# Patient Record
Sex: Female | Born: 1990 | Race: White | Hispanic: No | Marital: Married | Smoking: Never smoker
Health system: Southern US, Community
[De-identification: ages and names within clinical notes are randomized; demographics above are authoritative.]

## PROBLEM LIST (undated history)

## (undated) DIAGNOSIS — A609 Anogenital herpesviral infection, unspecified: Secondary | ICD-10-CM

## (undated) DIAGNOSIS — F32A Depression, unspecified: Secondary | ICD-10-CM

## (undated) DIAGNOSIS — F419 Anxiety disorder, unspecified: Secondary | ICD-10-CM

## (undated) DIAGNOSIS — Z8619 Personal history of other infectious and parasitic diseases: Secondary | ICD-10-CM

## (undated) DIAGNOSIS — D649 Anemia, unspecified: Secondary | ICD-10-CM

## (undated) DIAGNOSIS — H02886 Meibomian gland dysfunction of left eye, unspecified eyelid: Secondary | ICD-10-CM

## (undated) DIAGNOSIS — K589 Irritable bowel syndrome without diarrhea: Secondary | ICD-10-CM

## (undated) HISTORY — DX: Personal history of other infectious and parasitic diseases: Z86.19

## (undated) HISTORY — PX: TONSILLECTOMY: SUR1361

## (undated) HISTORY — PX: WISDOM TOOTH EXTRACTION: SHX21

## (undated) HISTORY — DX: Irritable bowel syndrome, unspecified: K58.9

## (undated) HISTORY — PX: BREAST ENHANCEMENT SURGERY: SHX7

## (undated) HISTORY — DX: Anogenital herpesviral infection, unspecified: A60.9

## (undated) HISTORY — DX: Anxiety disorder, unspecified: F41.9

---
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
## 2012-10-18 ENCOUNTER — Other Ambulatory Visit (HOSPITAL_COMMUNITY)
Admission: RE | Admit: 2012-10-18 | Discharge: 2012-10-18 | Disposition: A | Discharge status: Home / Self Care | Payer: 59 | Source: Ambulatory Visit | Attending: Obstetrics and Gynecology | Admitting: Obstetrics and Gynecology

## 2012-10-18 DIAGNOSIS — Z113 Encounter for screening for infections with a predominantly sexual mode of transmission: Secondary | ICD-10-CM | POA: Insufficient documentation

## 2012-10-18 DIAGNOSIS — N760 Acute vaginitis: Secondary | ICD-10-CM | POA: Insufficient documentation

## 2012-10-18 DIAGNOSIS — Z01419 Encounter for gynecological examination (general) (routine) without abnormal findings: Secondary | ICD-10-CM | POA: Insufficient documentation

## 2013-05-02 ENCOUNTER — Other Ambulatory Visit: Payer: Self-pay | Admitting: Unknown Physician Specialty

## 2013-05-02 DIAGNOSIS — R109 Unspecified abdominal pain: Secondary | ICD-10-CM

## 2013-05-02 DIAGNOSIS — E739 Lactose intolerance, unspecified: Secondary | ICD-10-CM | POA: Insufficient documentation

## 2013-05-06 ENCOUNTER — Other Ambulatory Visit: Payer: 59

## 2013-05-09 ENCOUNTER — Other Ambulatory Visit: Payer: 59

## 2013-05-13 ENCOUNTER — Ambulatory Visit
Admission: RE | Admit: 2013-05-13 | Discharge: 2013-05-13 | Disposition: A | Discharge status: Home / Self Care | Payer: 59 | Source: Ambulatory Visit | Attending: Unknown Physician Specialty | Admitting: Unknown Physician Specialty

## 2013-05-13 DIAGNOSIS — R109 Unspecified abdominal pain: Secondary | ICD-10-CM

## 2013-05-23 ENCOUNTER — Emergency Department (HOSPITAL_COMMUNITY)
Admission: EM | Admit: 2013-05-23 | Discharge: 2013-05-23 | Disposition: A | Discharge status: Home / Self Care | Payer: 59 | Attending: Emergency Medicine | Admitting: Emergency Medicine

## 2013-05-23 ENCOUNTER — Emergency Department (HOSPITAL_COMMUNITY): Payer: 59

## 2013-05-23 ENCOUNTER — Encounter (HOSPITAL_COMMUNITY): Payer: Self-pay | Admitting: Emergency Medicine

## 2013-05-23 DIAGNOSIS — Y9389 Activity, other specified: Secondary | ICD-10-CM | POA: Insufficient documentation

## 2013-05-23 DIAGNOSIS — S60229A Contusion of unspecified hand, initial encounter: Secondary | ICD-10-CM | POA: Insufficient documentation

## 2013-05-23 DIAGNOSIS — S60222A Contusion of left hand, initial encounter: Secondary | ICD-10-CM

## 2013-05-23 DIAGNOSIS — Y92410 Unspecified street and highway as the place of occurrence of the external cause: Secondary | ICD-10-CM | POA: Insufficient documentation

## 2013-05-23 DIAGNOSIS — S59909A Unspecified injury of unspecified elbow, initial encounter: Secondary | ICD-10-CM | POA: Insufficient documentation

## 2013-05-23 DIAGNOSIS — Z79899 Other long term (current) drug therapy: Secondary | ICD-10-CM | POA: Insufficient documentation

## 2013-05-23 DIAGNOSIS — S6990XA Unspecified injury of unspecified wrist, hand and finger(s), initial encounter: Secondary | ICD-10-CM | POA: Insufficient documentation

## 2013-05-23 MED ORDER — MORPHINE SULFATE 2 MG/ML IJ SOLN: 2.0000 mg | Freq: Once | INTRAMUSCULAR | Status: DC

## 2013-05-23 MED ORDER — MORPHINE SULFATE 4 MG/ML IJ SOLN
4.0000 mg | Freq: Once | INTRAMUSCULAR | Status: AC
  Administered 2013-05-23: 4 mg via INTRAVENOUS

## 2013-05-23 MED ORDER — HYDROCODONE-ACETAMINOPHEN 5-325 MG PO TABS: 1.0000 | ORAL_TABLET | Freq: Four times a day (QID) | ORAL | Status: DC | PRN

## 2013-05-23 MED ORDER — MORPHINE SULFATE 4 MG/ML IJ SOLN
4.0000 mg | Freq: Once | INTRAMUSCULAR | Status: DC
  Filled 2013-05-23: qty 1

## 2013-05-23 MED ORDER — HYDROCODONE-ACETAMINOPHEN 5-325 MG PO TABS
1.0000 | ORAL_TABLET | Freq: Once | ORAL | Status: AC
  Administered 2013-05-23: 1 via ORAL
  Filled 2013-05-23: qty 1
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
## 2013-05-23 NOTE — ED Provider Notes (Signed)
I saw and evaluated the patient, reviewed the resident's note and I agree with the findings and plan.  Home with thumb spica and ortho follow up  Lyanne Co, MD 05/23/13 [phone]
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
## 2014-09-04 ENCOUNTER — Other Ambulatory Visit (HOSPITAL_COMMUNITY)
Admission: RE | Admit: 2014-09-04 | Discharge: 2014-09-04 | Disposition: A | Discharge status: Home / Self Care | Payer: 59 | Source: Ambulatory Visit | Attending: Obstetrics and Gynecology | Admitting: Obstetrics and Gynecology

## 2014-09-04 ENCOUNTER — Other Ambulatory Visit: Payer: Self-pay | Admitting: Nurse Practitioner

## 2014-09-04 DIAGNOSIS — Z01419 Encounter for gynecological examination (general) (routine) without abnormal findings: Secondary | ICD-10-CM | POA: Insufficient documentation

## 2014-09-05 LAB — CYTOLOGY - PAP

## 2015-05-05 ENCOUNTER — Telehealth: Payer: Self-pay | Admitting: *Deleted

## 2015-05-05 NOTE — Telephone Encounter (Signed)
Unable to reach patient at time of Pre-Visit Call.  Left message for patient to return call when available.    

## 2015-05-06 ENCOUNTER — Other Ambulatory Visit: Payer: Self-pay | Admitting: Physician Assistant

## 2015-05-06 ENCOUNTER — Ambulatory Visit (INDEPENDENT_AMBULATORY_CARE_PROVIDER_SITE_OTHER): Payer: 59 | Admitting: Physician Assistant

## 2015-05-06 ENCOUNTER — Encounter: Payer: Self-pay | Admitting: Physician Assistant

## 2015-05-06 VITALS — BP 118/72 | HR 72 | Temp 98.2°F | Resp 16 | Ht 65.5 in | Wt 143.4 lb

## 2015-05-06 DIAGNOSIS — T7840XA Allergy, unspecified, initial encounter: Secondary | ICD-10-CM | POA: Insufficient documentation

## 2015-05-06 DIAGNOSIS — Z020 Encounter for examination for admission to educational institution: Secondary | ICD-10-CM | POA: Insufficient documentation

## 2015-05-06 DIAGNOSIS — Z0289 Encounter for other administrative examinations: Secondary | ICD-10-CM

## 2015-05-06 DIAGNOSIS — Z0184 Encounter for antibody response examination: Secondary | ICD-10-CM | POA: Diagnosis not present

## 2015-05-06 NOTE — Assessment & Plan Note (Signed)
Mild. No respiratory involvement.  Will refer to Allergy for testing.

## 2015-05-06 NOTE — Patient Instructions (Signed)
Go to the lab for blood work. We will call you with your results. Follow-up will be based on results.

## 2015-05-06 NOTE — Assessment & Plan Note (Addendum)
Depression screen negative. Health Maintenance reviewed.  Is followed by GYN for pelvic and PAP. Examination within normal limits. Forms filled out for patient.

## 2015-05-06 NOTE — Progress Notes (Signed)
Pre visit review using our clinic review tool, if applicable. No additional management support is needed unless otherwise documented below in the visit note/SLS  

## 2015-05-06 NOTE — Assessment & Plan Note (Signed)
Patient up-to-date on required immunizations. Has documented immunity to varicella.  Will order Hep B surface antibody and TB Gold assay today.

## 2015-05-06 NOTE — Progress Notes (Signed)
Patient presents to clinic today  To establish care. Patient is requesting physical for school. 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 Is needing hepatitis B immunity status testing and TB gold assay.     Patient with past medical history including irritable bowel syndrome and mild anxiety which she controls with diet, exercise the meditation. Is not currently on medication for either problem. Is followed by gynecology who prescribes her contraception patch. Takes a multivitamin but denies other medications.   Patient endorses bee sting of her foot 2 weeks ago that resulted in significant swelling and pain of foot. Patient denies urticaria , shortness of breath or chest tightness. Denies prior reaction to bee sting.  Past Medical History  Diagnosis Date  . History of chicken pox   . IBS (irritable bowel syndrome)   . Anxiety     No current outpatient prescriptions on file prior to visit.   No current facility-administered medications on file prior to visit.    No Known Allergies  Family History  Problem Relation Age of Onset  . Hypertension Father     Living  . Obesity Father   . Heart disease Paternal Grandfather   . Diabetes Paternal Grandfather   . Hyperlipidemia Paternal Grandfather   . Heart attack Paternal Grandfather   . Hypertension Paternal Grandfather   . Breast cancer Paternal Grandmother   . Urolithiasis Mother     Living  . Skin cancer Maternal Grandmother   . Renal Disease Maternal Grandfather   . Bipolar disorder Maternal Aunt   . Healthy Brother     x1    History   Social History  . Marital Status: Single    Spouse Name: N/A  . Number of Children: N/A  . Years of Education: N/A   Social History Main Topics  . Smoking status: Never Smoker   . Smokeless tobacco: Not on file  . Alcohol Use: Yes     Comment: social  . Drug Use: No  . Sexual Activity: Not on file   Other Topics Concern  . None   Social History Narrative   [place]ives in [place]  with mother.    Review of Systems  Constitutional: Negative for fever and weight loss.  HENT: Negative for ear discharge, ear pain, hearing loss and tinnitus.   Eyes: Negative for blurred vision, double vision, photophobia and pain.  Respiratory: Negative for cough and shortness of breath.   Cardiovascular: Negative for chest pain and palpitations.  Gastrointestinal: Negative for heartburn, nausea, vomiting, abdominal pain, diarrhea, constipation, blood in stool and melena.  Genitourinary: Negative for dysuria, urgency, frequency, hematuria and flank pain.  Musculoskeletal: Negative for falls.  Neurological: Negative for dizziness, loss of consciousness and headaches.  Endo/Heme/Allergies: Negative for environmental allergies.  Psychiatric/Behavioral: Negative for depression, suicidal ideas, hallucinations and substance abuse. The patient is not nervous/anxious and does not have insomnia.    BP 118/72 mmHg  Pulse 72  Temp(Src) 98.2 F (36.8 C) (Oral)  Resp 16  Ht 5' 5.5" (1.664 m)  Wt 143 lb 6 oz (65.034 kg)  BMI 23.49 kg/m2  SpO2 100%  LMP 04/08/2015  Physical Exam  Constitutional: She is oriented to person, place, and time and well-developed, well-nourished, and in no distress.  HENT:  Head: Normocephalic and atraumatic.  Right Ear: External ear normal.  Left Ear: External ear normal.  Nose: Nose normal.  Mouth/Throat: Oropharynx is clear and moist.  Tympanic membranes within normal limits bilaterally.  Eyes: Conjunctivae and  EOM are normal. Pupils are equal, round, and reactive to light.  Neck: No thyromegaly present.  Cardiovascular: Normal rate, regular rhythm, normal heart sounds and intact distal pulses.   Pulmonary/Chest: Effort normal and breath sounds normal. No respiratory distress. She has no wheezes. She has no rales. She exhibits no tenderness.  Abdominal: Soft. Bowel sounds are normal. There is no tenderness.  Musculoskeletal: Normal range of motion.    Lymphadenopathy:    She has no cervical adenopathy.  Neurological: She is alert and oriented to person, place, and time.  Skin: Skin is warm and dry. No rash noted.  Psychiatric: Affect normal.  Vitals reviewed.  Assessment/Plan: Immunity status testing Patient up-to-date on required immunizations. Has documented immunity to varicella.  Will order Hep B surface antibody and TB Gold assay today.  Allergic reaction Mild. No respiratory involvement.  Will refer to Allergy for testing.  School physical exam Depression screen negative. Health Maintenance reviewed.  Is followed by GYN for pelvic and PAP. Examination within normal limits. Forms filled out for patient.

## 2015-05-07 LAB — HEPATITIS B SURFACE ANTIBODY,QUALITATIVE: Hep B S Ab: POSITIVE — AB

## 2015-05-08 LAB — QUANTIFERON TB GOLD ASSAY (BLOOD)
Interferon Gamma Release Assay: NEGATIVE
Mitogen value: 6.06 IU/mL
Quantiferon Nil Value: 0.03 IU/mL
Quantiferon Tb Ag Minus Nil Value: 0.01 IU/mL
TB Ag value: 0.04 IU/mL

## 2015-05-14 ENCOUNTER — Telehealth: Payer: Self-pay

## 2015-05-14 DIAGNOSIS — Z0184 Encounter for antibody response examination: Secondary | ICD-10-CM

## 2015-05-14 NOTE — Telephone Encounter (Signed)
Spoke with Silvio Pate, Lab Tech regarding lab order. She said she would call [facility] for correct code to ensure lab will be ordered and drawn correctly.

## 2015-05-14 NOTE — Telephone Encounter (Signed)
Per Silvio Pate, [facility] says [employer] does not have the correct code in the system for this particular lab draw.  Therefore Dana Serrano will draw lab tomorrow and call [facility] have them placed the correct order.

## 2015-05-14 NOTE — Telephone Encounter (Addendum)
Pt states she needs a quantitative HEPATITIS B SURFACE ANTIBODY drawn instead of a qualitative.  Called [facility] to see if it could be added to lab already drawn.  Lab tech stated it was too late.  Pt informed.  Lab appointment scheduled. Future ordered placed.

## 2015-05-14 NOTE — Addendum Note (Signed)
Addended by: Silvio Pate D on: 05/14/2015 03:24 PM   Modules accepted: Orders

## 2015-05-14 NOTE — Telephone Encounter (Signed)
That is what was ordered last time as well.  Can you please verify with the lab that they will draw the correct thing.  Cannot find other order from what you sent me.

## 2015-05-15 ENCOUNTER — Other Ambulatory Visit: Payer: Self-pay | Admitting: Physician Assistant

## 2015-05-15 ENCOUNTER — Other Ambulatory Visit (INDEPENDENT_AMBULATORY_CARE_PROVIDER_SITE_OTHER): Payer: 59

## 2015-05-15 DIAGNOSIS — Z0184 Encounter for antibody response examination: Secondary | ICD-10-CM | POA: Diagnosis not present

## 2015-05-19 LAB — HEPATITIS B SURFACE ANTIBODY, QUANTITATIVE: Hep B S AB Quant (Post): 25.6 m[IU]/mL

## 2015-05-19 LAB — HEPATITIS B DNA, ULTRAQUANTITATIVE, PCR: Hepatitis B DNA: NOT DETECTED IU/mL (ref <20)

## 2016-04-29 ENCOUNTER — Telehealth: Payer: Self-pay | Admitting: Physician Assistant

## 2016-04-29 ENCOUNTER — Other Ambulatory Visit (INDEPENDENT_AMBULATORY_CARE_PROVIDER_SITE_OTHER): Payer: Federal, State, Local not specified - PPO

## 2016-04-29 ENCOUNTER — Other Ambulatory Visit: Payer: Self-pay | Admitting: Physician Assistant

## 2016-04-29 DIAGNOSIS — Z111 Encounter for screening for respiratory tuberculosis: Secondary | ICD-10-CM

## 2016-04-29 DIAGNOSIS — Z020 Encounter for examination for admission to educational institution: Secondary | ICD-10-CM

## 2016-04-29 DIAGNOSIS — Z0184 Encounter for antibody response examination: Secondary | ICD-10-CM | POA: Diagnosis not present

## 2016-04-29 DIAGNOSIS — T7840XA Allergy, unspecified, initial encounter: Secondary | ICD-10-CM

## 2016-04-29 NOTE — Telephone Encounter (Signed)
Scheduled pt

## 2016-04-29 NOTE — Telephone Encounter (Signed)
Can be reached: [phone]  Reason for call: Pt needing TB quantiferon gold test done and has to have results submitted to her school by 05/05/16. Pt due for cpe but last was 05/06/15 so it is too soon for cpe. Can she come in for lab and schedule cpe later?

## 2016-04-29 NOTE — Telephone Encounter (Signed)
That is fine. I will place order and she can be put on the lab schedule.

## 2016-04-30 LAB — HEPATITIS B SURFACE ANTIBODY, QUANTITATIVE: Hepatitis B-Post: 30.7 m[IU]/mL

## 2016-05-02 LAB — QUANTIFERON TB GOLD ASSAY (BLOOD)
Interferon Gamma Release Assay: NEGATIVE
Mitogen-Nil: >10 IU/mL
Quantiferon Nil Value: 0.02 IU/mL
Quantiferon Tb Ag Minus Nil Value: 0.02 IU/mL

## 2016-05-02 LAB — HEPATITIS B DNA, QUALITATIVE: Hepatitis B Virus DNA Qual: NOT DETECTED

## 2016-05-04 ENCOUNTER — Encounter: Payer: Self-pay | Admitting: *Deleted

## 2016-05-06 LAB — HEPATITIS B DNA, ULTRAQUANTITATIVE, PCR
Hepatitis B DNA (Calc): <1.3 Log IU/mL (ref <1.30)
Hepatitis B DNA: <20 IU/mL (ref <20)

## 2016-10-17 ENCOUNTER — Encounter: Payer: Self-pay | Admitting: Physician Assistant

## 2016-10-17 ENCOUNTER — Ambulatory Visit (INDEPENDENT_AMBULATORY_CARE_PROVIDER_SITE_OTHER): Admitting: Physician Assistant

## 2016-10-17 VITALS — BP 118/78 | HR 81 | Temp 98.3°F | Resp 14 | Ht 65.5 in | Wt 148.0 lb

## 2016-10-17 DIAGNOSIS — M79605 Pain in left leg: Secondary | ICD-10-CM

## 2016-10-17 DIAGNOSIS — M79604 Pain in right leg: Secondary | ICD-10-CM | POA: Diagnosis not present

## 2016-10-17 MED ORDER — CELECOXIB 100 MG PO CAPS: 100.0000 mg | ORAL_CAPSULE | Freq: Every day | ORAL | 0 refills | Status: DC | PRN

## 2016-10-17 NOTE — Patient Instructions (Signed)
Please stay well hydrated, especially pre and post workouts. Wear supportive foot wear.  Maybe go easy on the workouts until we get further assessment.  With your exam, muscular cause of symptoms seems most likely. I am setting you up with Dr. Pearletha ForgeHudnall (Sports Medicine) for a comprehensive evaluation.   I have sent in a script for Celebrex in case you need an anti-inflammatory for the episodes. This should be gentler on your stomach but still take with food.   If you do not hear from the specialist by this Wednesday, please give me a call.

## 2016-10-17 NOTE — Progress Notes (Signed)
Pre visit review using our clinic review tool, if applicable. No additional management support is needed unless otherwise documented below in the visit note. 

## 2016-10-17 NOTE — Progress Notes (Signed)
Patient presents to clinic today c/o intermittent pain in bilateral lower extremities x 9 years. Worsening over the past year with increased physical activity. Endorses episodes of pain in lateral anterior lower extremities inferior to knee. Episodes are described as aching and pulling in nature. Are occurring after exercise or with wearing certain footwear. Denies knee, foot or ankle pain. Pain usually 8/10 when present, lasting for few hours to a couple of days. Denies swelling, numbness or tingling of extremities. Denies pain during exercise, only when resting afterwards. Has taken some Ibuprofen with some relief of pain but is trying to avoid NSAIDs due to history of IBS. Notes being sent to an Orthopedist for this issue about 4 years ago. Was told she needed to see a different specialist but cannot elaborate further.   Past Medical History:  Diagnosis Date  . Anxiety   . History of chicken pox   . IBS (irritable bowel syndrome)     Current Outpatient Prescriptions on File Prior to Visit  Medication Sig Dispense Refill  . Multiple Vitamin (MULTIVITAMIN) tablet Take 1 tablet by mouth daily.     No current facility-administered medications on file prior to visit.    No Known Allergies  Family History  Problem Relation Age of Onset  . Hypertension Father     Living  . Obesity Father   . Heart disease Paternal Grandfather   . Diabetes Paternal Grandfather   . Hyperlipidemia Paternal Grandfather   . Heart attack Paternal Grandfather   . Hypertension Paternal Grandfather   . Breast cancer Paternal Grandmother   . Urolithiasis Mother     Living  . Skin cancer Maternal Grandmother   . Renal Disease Maternal Grandfather   . Bipolar disorder Maternal Aunt   . Healthy Brother     x1    Social History   Social History  . Marital status: Single    Spouse name: N/A  . Number of children: N/A  . Years of education: N/A   Social History Main Topics  . Smoking status: Never  Smoker  . Smokeless tobacco: Never Used  . Alcohol use Yes     Comment: social  . Drug use: No  . Sexual activity: Not Asked   Other Topics Concern  . None   Social History Narrative   [place] in [place].    Review of Systems - See HPI.  All other ROS are negative.  BP 118/78   Pulse 81   Temp 98.3 F (36.8 C) (Oral)   Resp 14   Ht 5' 5.5" (1.664 m)   Wt 148 lb (67.1 kg)   SpO2 98%   BMI 24.25 kg/m   Physical Exam  Constitutional: She is oriented to person, place, and time and well-developed, well-nourished, and in no distress.  HENT:  Head: Normocephalic and atraumatic.  Eyes: Conjunctivae are normal.  Cardiovascular: Normal rate, regular rhythm, normal heart sounds and intact distal pulses.   Pulmonary/Chest: Effort normal and breath sounds normal.  Musculoskeletal:       Right knee: Normal.       Left knee: Normal.       Right ankle: Normal.       Left ankle: Normal.       Legs:      Right foot: Normal.       Left foot: Normal.  Negative posterior calf pain. Negative swelling.   Neurological: She is alert and oriented to person, place, and time.  Skin: Skin  is warm and dry.  Vitals reviewed.  Assessment/Plan: 1. Bilateral leg pain Exam reveals mild tenderness to palpation over lateral muscle groups (anterior tibialis versus extensor digitorum longus). Supportive measures and appropriate footwear reviewed. Rx Celebrex to use daily PRN for episodes to cut down on Gi upset.   - Ambulatory referral to Sports Medicine    Piedad ClimesMartin, Lubna Stegeman Cody, PA-C

## 2016-10-19 ENCOUNTER — Ambulatory Visit (INDEPENDENT_AMBULATORY_CARE_PROVIDER_SITE_OTHER): Admitting: Family Medicine

## 2016-10-19 ENCOUNTER — Ambulatory Visit (HOSPITAL_BASED_OUTPATIENT_CLINIC_OR_DEPARTMENT_OTHER)
Admission: RE | Admit: 2016-10-19 | Discharge: 2016-10-19 | Disposition: A | Discharge status: Home / Self Care | Source: Ambulatory Visit | Attending: Family Medicine | Admitting: Family Medicine

## 2016-10-19 ENCOUNTER — Telehealth: Payer: Self-pay | Admitting: Family Medicine

## 2016-10-19 ENCOUNTER — Encounter: Payer: Self-pay | Admitting: Family Medicine

## 2016-10-19 VITALS — BP 112/69 | HR 79 | Ht 65.0 in | Wt 148.0 lb

## 2016-10-19 DIAGNOSIS — M79605 Pain in left leg: Secondary | ICD-10-CM | POA: Insufficient documentation

## 2016-10-19 DIAGNOSIS — M79604 Pain in right leg: Secondary | ICD-10-CM | POA: Insufficient documentation

## 2016-10-19 NOTE — Patient Instructions (Signed)
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 Ice the area 15 minutes at a time 3-4 times a day and after exercise. Medicines are unlikely to be helpful long term. Start physical therapy and do home exercises on days you don't go to therapy. Inserts are important - OTC one (dr. Jari Sportsmanscholls active series, spencos, superfeet), sports insoles with scaphoid pads, or custom orthotics. If after 5-6 weeks you're not improving call me and we will go ahead with compartment pressure testing.

## 2016-10-19 NOTE — Telephone Encounter (Signed)
Called patient - orders placed - she will come back to get them and we will call her with results.

## 2016-10-24 DIAGNOSIS — M79604 Pain in right leg: Secondary | ICD-10-CM | POA: Insufficient documentation

## 2016-10-24 DIAGNOSIS — M79605 Pain in left leg: Principal | ICD-10-CM

## 2016-10-24 NOTE — Assessment & Plan Note (Signed)
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  Independently reviewed radiographs of tibias to ensure no anterior tibial stress fracture referring pain - these are normal.  She will start with conservative measures - physical therapy, orthotics, icing.  Tylenol or ibuprofen if needed.  We discussed compartment pressure testing as next step if she would consider possible fasciotomy.  She will call us if not improving.

## 2016-10-24 NOTE — Progress Notes (Signed)
PCP and consultation requested by: Piedad ClimesMartin, William Cody, PA-C  Subjective:   HPI: Patient is a 25 y.o. female here for bilateral leg pain.  Patient reports she's had intermittent issues with both legs for about 9 years though this has worsened in the past year. Reports getting pain anterior lower legs bilaterally with exercising. Pain in this area typically lasts 4-6 hours when it comes on though can last up to 2 days. Doesn't seem to bother when exercising so much as afterwards. Can get numbness into feet with this. Pressure/massage of anterior tibialis area helps. Stretching has helped. Tried nsaids without much benefit. Does feel this on right side with driving also. No skin changes.  Past Medical History:  Diagnosis Date  . Anxiety   . History of chicken pox   . IBS (irritable bowel syndrome)     Current Outpatient Prescriptions on File Prior to Visit  Medication Sig Dispense Refill  . celecoxib (CELEBREX) 100 MG capsule Take 1 capsule (100 mg total) by mouth daily as needed. 30 capsule 0  . Multiple Vitamin (MULTIVITAMIN) tablet Take 1 tablet by mouth daily.    . Norethindrone Acetate-Ethinyl Estrad-FE (BLISOVI 24 FE) 1-20 MG-MCG(24) tablet Take 1 tablet by mouth daily.     No current facility-administered medications on file prior to visit.     Past Surgical History:  Procedure Laterality Date  . BREAST ENHANCEMENT SURGERY     2013  . TONSILLECTOMY     2009  . WISDOM TOOTH EXTRACTION     08.2015    No Known Allergies  Social History   Social History  . Marital status: Single    Spouse name: N/A  . Number of children: N/A  . Years of education: N/A   Occupational History  . Not on file.   Social History Main Topics  . Smoking status: Never Smoker  . Smokeless tobacco: Never Used  . Alcohol use Yes     Comment: social  . Drug use: No  . Sexual activity: Not on file   Other Topics Concern  . Not on file   Social History Narrative   [place]ives in [place]  with [place].     Family History  Problem Relation Age of Onset  . Hypertension Father     Living  . Obesity Father   . Heart disease Paternal Grandfather   . Diabetes Paternal Grandfather   . Hyperlipidemia Paternal Grandfather   . Heart attack Paternal Grandfather   . Hypertension Paternal Grandfather   . Breast cancer Paternal Grandmother   . Urolithiasis Mother     Living  . Skin cancer Maternal Grandmother   . Renal Disease Maternal Grandfather   . Bipolar disorder Maternal Aunt   . Healthy Brother     x1    BP 112/69   Pulse 79   Ht 5\' 5"  (1.651 m)   Wt 148 lb (67.1 kg)   LMP 09/23/2016   BMI 24.63 kg/m   Review of Systems: See HPI above.     Objective:  Physical Exam:  Gen: NAD, comfortable in exam room  Bilateral legs: No gross deformity, swelling, bruising. Moderate pronation of long arches. No tenderness to palpation currently. FROM ankles and knee with 5/5 strength all directions. Negative fulcrum tests. NVI distally currently. Gait normal aside from pronation noted above.   Assessment & Plan:  1. 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  Independently reviewed radiographs of tibias to ensure no  anterior tibial stress fracture referring pain - these are normal.  She will start with conservative measures - physical therapy, orthotics, icing.  Tylenol or ibuprofen if needed.  We discussed compartment pressure testing as next step if she would consider possible fasciotomy.  She will call us if not improving.

## 2016-11-09 ENCOUNTER — Telehealth: Payer: Self-pay | Admitting: Physician Assistant

## 2016-11-09 NOTE — Telephone Encounter (Signed)
error:[id number] ° °

## 2016-11-15 LAB — HM PAP SMEAR

## 2016-11-17 ENCOUNTER — Other Ambulatory Visit: Payer: Self-pay | Admitting: Physician Assistant

## 2016-11-18 NOTE — Telephone Encounter (Signed)
Please advise of continue use of Celebrex medication.

## 2016-11-18 NOTE — Telephone Encounter (Signed)
Needs FU with Dr. Pearletha Forgehudnall per his OV note for compartment testing.  Celebrex was intended for short-term use until we could get further assessment with specialist.

## 2016-11-23 NOTE — Telephone Encounter (Signed)
Per DPR - Left a detailed message on cell phone voicemail explaining Cody's notes below.   Instructed patient to call Dr. Lazaro ArmsHudnall's office for follow-up.

## 2017-02-03 ENCOUNTER — Ambulatory Visit (INDEPENDENT_AMBULATORY_CARE_PROVIDER_SITE_OTHER): Admitting: Physician Assistant

## 2017-02-03 ENCOUNTER — Encounter: Payer: Self-pay | Admitting: Physician Assistant

## 2017-02-03 VITALS — BP 120/80 | HR 84 | Temp 98.5°F | Resp 14 | Ht 65.0 in | Wt 149.0 lb

## 2017-02-03 DIAGNOSIS — R5383 Other fatigue: Secondary | ICD-10-CM | POA: Diagnosis not present

## 2017-02-03 DIAGNOSIS — F411 Generalized anxiety disorder: Secondary | ICD-10-CM

## 2017-02-03 LAB — CBC
HCT: 43.1 % (ref 36.0–46.0)
Hemoglobin: 14.9 g/dL (ref 12.0–15.0)
MCHC: 34.6 g/dL (ref 30.0–36.0)
MCV: 89.6 fl (ref 78.0–100.0)
Platelets: 222 10*3/uL (ref 150.0–400.0)
RBC: 4.81 Mil/uL (ref 3.87–5.11)
RDW: 12.7 % (ref 11.5–15.5)
WBC: 7.5 10*3/uL (ref 4.0–10.5)

## 2017-02-03 LAB — TSH: TSH: 0.6 u[IU]/mL (ref 0.35–4.50)

## 2017-02-03 LAB — VITAMIN B12: Vitamin B-12: 299 pg/mL (ref 211–911)

## 2017-02-03 MED ORDER — SERTRALINE HCL 25 MG PO TABS: 25.0000 mg | ORAL_TABLET | Freq: Every day | ORAL | 1 refills | Status: DC

## 2017-02-03 NOTE — Patient Instructions (Signed)
Please go to the lab for blood work.  I will call you with your results.   Please start the Sertraline taking 25 mg (1 tablet) daily for 2 weeks. If tolerating well, can increase to 50 mg (2 tablets) daily. Follow-up in 4-6 weeks.  We will alter regimen based on results.  If you note any worsening mood or thoughts of harming yourself or others on the medication, please stop and come see us or go to the ER for assessment.

## 2017-02-03 NOTE — Progress Notes (Signed)
Patient presents to clinic today for acute concerns  Patient presents to clinic today c/o performance anxiety. Pt is currently in grad school, states she is overwhelmed by constant presentations and program requirements. Pt endorses hx of generalized anxiety (began in middle school following parent's divorce). Pt states that prior to onset of performance anxiety, she managed stress through self-mediating techniques (exercise, breathing methods). Pt states that this is much worse than prior anxiety. Pt admits chest flushing, palpitations, diaphoresis and jitteriness when episodes occur. States episodes occur 1-2 times a week. Denies these symptoms during undergraduate college, states anxiety was much more manageable. Denies depression, mood swings, SI/HI. States anxiety does not affect sleep. Wants to discuss medication options to help manage.   Pt also c/o fatigue. States that she is still able to complete daily tasks. States the fatigue is reason for wanting labs drawn. Denies insomnia. States she is able to exercise. Denies excess sleepiness, describes fatigue as a lack of energy. Believes the anxiety may be contributing to this.   Health Maintenance: Immunizations -- flu shot up-to-date. Tetanus up-to-date.    Past Medical History:  Diagnosis Date  . Anxiety   . History of chicken pox   . IBS (irritable bowel syndrome)     Past Surgical History:  Procedure Laterality Date  . BREAST ENHANCEMENT SURGERY     2013  . TONSILLECTOMY     2009  . WISDOM TOOTH EXTRACTION     08.2015    Current Outpatient Prescriptions on File Prior to Visit  Medication Sig Dispense Refill  . Multiple Vitamin (MULTIVITAMIN) tablet Take 1 tablet by mouth daily.    . Norethindrone Acetate-Ethinyl Estrad-FE (BLISOVI 24 FE) 1-20 MG-MCG(24) tablet Take 1 tablet by mouth daily.     No current facility-administered medications on file prior to visit.    No Known Allergies  Family History  Problem  Relation Age of Onset  . Hypertension Father     Living  . Obesity Father   . Heart disease Paternal Grandfather   . Diabetes Paternal Grandfather   . Hyperlipidemia Paternal Grandfather   . Heart attack Paternal Grandfather   . Hypertension Paternal Grandfather   . Breast cancer Paternal Grandmother   . Urolithiasis Mother     Living  . Skin cancer Maternal Grandmother   . Renal Disease Maternal Grandfather   . Bipolar disorder Maternal Aunt   . Healthy Brother     x1    Social History   Social History  . Marital status: Single    Spouse name: N/A  . Number of children: N/A  . Years of education: N/A   Occupational History  . Not on file.   Social History Main Topics  . Smoking status: Never Smoker  . Smokeless tobacco: Never Used  . Alcohol use Yes     Comment: social  . Drug use: No  . Sexual activity: Yes   Other Topics Concern  . Not on file   Social History Narrative   Lives in [place] with mother.    Review of Systems  Constitutional: Negative for fever and weight loss.  HENT: Negative for ear discharge, ear pain, hearing loss and tinnitus.   Eyes: Negative for blurred vision, double vision, photophobia and pain.  Respiratory: Negative for cough and shortness of breath.   Cardiovascular: Negative for chest pain.  Gastrointestinal: Negative for abdominal pain, blood in stool, constipation, diarrhea, heartburn, melena, nausea and vomiting.  Genitourinary: Negative for dysuria, flank pain, frequency,  hematuria and urgency.  Musculoskeletal: Negative for falls.  Neurological: Negative for dizziness, loss of consciousness and headaches.  Endo/Heme/Allergies: Negative for environmental allergies.  Psychiatric/Behavioral: Negative for depression, hallucinations, substance abuse and suicidal ideas. The patient is nervous/anxious. The patient does not have insomnia.    BP 120/80   Pulse 84   Temp 98.5 F (36.9 C) (Oral)   Resp 14   Ht 5\' 5"  (1.651 m)   Wt 149  lb (67.6 kg)   SpO2 99%   BMI 24.79 kg/m   Physical Exam  Constitutional: She is well-developed, well-nourished, and in no distress. No distress.  HENT:  Right Ear: External ear normal.  Left Ear: External ear normal.  Mouth/Throat: No oropharyngeal exudate.  Eyes: Conjunctivae are normal. Pupils are equal, round, and reactive to light. Right eye exhibits no discharge. Left eye exhibits no discharge. No scleral icterus.  Neck: Neck supple. No thyromegaly present.  Cardiovascular: Normal rate, regular rhythm and normal heart sounds.  Exam reveals no gallop and no friction rub.   No murmur heard. Pulmonary/Chest: Effort normal. No respiratory distress. She has no wheezes. She has no rales.  Lymphadenopathy:    She has no cervical adenopathy.  Neurological: She is alert.  Skin: Skin is warm and dry. No rash noted. She is not diaphoretic.  Psychiatric: Affect normal.   Assessment/Plan: 1. Generalized anxiety disorder With performance anxiety. Labs today to include TSH level. Discussed counseling. Patient declines at present. Recommended mindfulness training. Resources given. Will start Sertraline at 25 mg daily If tolerating well at 2 weeks, can increase to 50 mg. FU 4 weeks. Alarm signs/symptoms reviewed that would prompt ER assessment. - TSH  2. Other fatigue Lab panel today to further assess. Exam unremarkable. Suspect related to anxiety.  - CBC - TSH - Vitamin D 1,25 dihydroxy - B12  Patient voiced understanding and agreement with today's plan.    Piedad Climes, PA-C

## 2017-02-03 NOTE — Progress Notes (Signed)
Pre visit review using our clinic review tool, if applicable. No additional management support is needed unless otherwise documented below in the visit note. 

## 2017-02-09 LAB — VITAMIN D 1,25 DIHYDROXY
Vitamin D 1, 25 (OH)2 Total: 76 pg/mL — ABNORMAL HIGH (ref 18–72)
Vitamin D2 1, 25 (OH)2: <8 pg/mL
Vitamin D3 1, 25 (OH)2: 76 pg/mL

## 2017-02-20 ENCOUNTER — Other Ambulatory Visit: Payer: Self-pay | Admitting: Emergency Medicine

## 2017-02-20 ENCOUNTER — Telehealth: Payer: Self-pay | Admitting: Physician Assistant

## 2017-02-20 MED ORDER — SERTRALINE HCL 25 MG PO TABS: 25.0000 mg | ORAL_TABLET | Freq: Two times a day (BID) | ORAL | 1 refills | Status: DC

## 2017-02-20 NOTE — Telephone Encounter (Signed)
Patient states she was told to call back to inform pcp when she increased her sertraline (ZOLOFT) 25 MG tablet.  She states on Friday, 02/17/17 she increased dosage from 25mg  to 50mg .  Since she is now taking 2 pills instead of 1 , she will need a sooner refill.  She wants to know if this would be ok.    Pharmacy: [id number] - [place], [place] - [address] [phone] (Phone) [phone] (Fax)

## 2017-02-20 NOTE — Telephone Encounter (Signed)
Sent in the Sertraline 25 mg bid #60 1 RF.

## 2017-02-21 NOTE — Telephone Encounter (Signed)
LMOVM advising patient rx was sent to the pharmacy and continue current medication as directed until follow up appointment in April.

## 2017-03-03 ENCOUNTER — Encounter: Payer: Self-pay | Admitting: Physician Assistant

## 2017-03-03 ENCOUNTER — Ambulatory Visit (INDEPENDENT_AMBULATORY_CARE_PROVIDER_SITE_OTHER): Admitting: Physician Assistant

## 2017-03-03 VITALS — BP 108/70 | HR 71 | Temp 99.0°F | Resp 14 | Ht 65.0 in | Wt 148.0 lb

## 2017-03-03 DIAGNOSIS — F411 Generalized anxiety disorder: Secondary | ICD-10-CM | POA: Insufficient documentation

## 2017-03-03 MED ORDER — SERTRALINE HCL 100 MG PO TABS: 100.0000 mg | ORAL_TABLET | Freq: Every day | ORAL | 3 refills | Status: DC

## 2017-03-03 NOTE — Progress Notes (Signed)
Pre visit review using our clinic review tool, if applicable. No additional management support is needed unless otherwise documented below in the visit note. 

## 2017-03-03 NOTE — Assessment & Plan Note (Signed)
Discussed that sexual side effect may increase on higher dose. She wishes to increase dose as only mild improvement in anxiety noted and she has not noted any other side effects. Will attempt trial of 100 mg but if no further improvement in anxiety, recommend changing to another SSRI. If anxiety improved by anorgasmia still present can consider addition of Wellbutrin.

## 2017-03-03 NOTE — Progress Notes (Signed)
Patient presents to clinic today for follow-up of anxiety. Was started on sertraline and titrated from 25 mg to 50 mg after tolerating 25 mg dose for 2 weeks. Endorses still taking as directed. Starting to note mild improvement in anxiety. Denies depressed mood or anhedonia. Denies panic. Has noted some anorgasmia since starting medication. States this is not an issue for her at present. She would like to discuss increased dose of medication.  Past Medical History:  Diagnosis Date  . Anxiety   . History of chicken pox   . IBS (irritable bowel syndrome)     Current Outpatient Prescriptions on File Prior to Visit  Medication Sig Dispense Refill  . Multiple Vitamin (MULTIVITAMIN) tablet Take 1 tablet by mouth daily.     No current facility-administered medications on file prior to visit.     No Known Allergies  Family History  Problem Relation Age of Onset  . Hypertension Father     Living  . Obesity Father   . Heart disease Paternal Grandfather   . Diabetes Paternal Grandfather   . Hyperlipidemia Paternal Grandfather   . Heart attack Paternal Grandfather   . Hypertension Paternal Grandfather   . Breast cancer Paternal Grandmother   . Urolithiasis Mother     Living  . Skin cancer Maternal Grandmother   . Renal Disease Maternal Grandfather   . Bipolar disorder Maternal Aunt   . Healthy Brother     x1    Social History   Social History  . Marital status: Single    Spouse name: N/A  . Number of children: N/A  . Years of education: N/A   Social History Main Topics  . Smoking status: Never Smoker  . Smokeless tobacco: Never Used  . Alcohol use Yes     Comment: social  . Drug use: No  . Sexual activity: Yes   Other Topics Concern  . None   Social History Narrative   Lives in [place] with mother.     Review of Systems - See HPI.  All other ROS are negative.  BP 108/70   Pulse 71   Temp 99 F (37.2 C) (Oral)   Resp 14   Ht  (1.651 m)   Wt 148 lb (67.1  kg)   SpO2 99%   BMI 24.63 kg/m   Physical Exam  Constitutional: She is oriented to person, place, and time and well-developed, well-nourished, and in no distress.  HENT:  Head: Normocephalic and atraumatic.  Eyes: Conjunctivae are normal.  Neck: Neck supple.  Cardiovascular: Normal rate, regular rhythm, normal heart sounds and intact distal pulses.   Pulmonary/Chest: Effort normal. No respiratory distress.  Neurological: She is alert and oriented to person, place, and time.  Skin: Skin is warm and dry. No rash noted.  Psychiatric: Affect normal.  Vitals reviewed.   Recent Results (from the past 2160 hour(s))  Vitamin D 1,25 dihydroxy     Status: Abnormal   Collection Time: 02/03/17  2:15 PM  Result Value Ref Range   Vitamin D 1, 25 (OH)2 Total 76 (H) 18 - 72 pg/mL   Vitamin D3 1, 25 (OH)2 76 pg/mL   Vitamin D2 1, 25 (OH)2 <8 pg/mL    Comment: Vitamin D3, 1,25(OH)2 indicates both endogenous production and supplementation.  Vitamin D2, 1,25(OH)2 is an indicator of exogeous sources, such as diet or supplementation.  Interpretation and therapy are based on measurement of Vitamin D,1,25(OH)2, Total. This test was developed and its analytical performance characteristics  have been determined by [facility], [place], [place]. It has not been cleared or approved by the FDA. This assay has been validated pursuant to the CLIA regulations and is used for clinical purposes.   CBC     Status: None   Collection Time: 02/03/17  3:15 PM  Result Value Ref Range   WBC 7.5 4.0 - 10.5 K/uL   RBC 4.81 3.87 - 5.11 Mil/uL   Platelets 222.0 150.0 - 400.0 K/uL   Hemoglobin 14.9 12.0 - 15.0 g/dL   HCT 08.6 57.8 - 46.9 %   MCV 89.6 78.0 - 100.0 fl   MCHC 34.6 30.0 - 36.0 g/dL   RDW 62.9 52.8 - 41.3 %  TSH     Status: None   Collection Time: 02/03/17  3:15 PM  Result Value Ref Range   TSH 0.60 0.35 - 4.50 uIU/mL  B12     Status: None   Collection Time: 02/03/17  3:15 PM   Result Value Ref Range   Vitamin B-12 299 211 - 911 pg/mL    Assessment/Plan: Generalized anxiety disorder Discussed that sexual side effect may increase on higher dose. She wishes to increase dose as only mild improvement in anxiety noted and she has not noted any other side effects. Will attempt trial of 100 mg but if no further improvement in anxiety, recommend changing to another SSRI. If anxiety improved by anorgasmia still present can consider addition of Wellbutrin.    Piedad Climes, PA-C

## 2017-03-03 NOTE — Patient Instructions (Signed)
We will try the increased dose of Sertraline as per our discussion. Take one tablet daily. Again sexual side effect may worsen with higher dose. If anxiety is not further improving with new dose, recommend we switch to another SSRI medication.  Please call me in the next couple of weeks to let me know how you are doing.   Don't study too hard!

## 2017-04-27 ENCOUNTER — Other Ambulatory Visit: Payer: Self-pay | Admitting: Physician Assistant

## 2017-04-27 NOTE — Telephone Encounter (Signed)
Patient has appt on 04/28/17 to discuss

## 2017-04-28 ENCOUNTER — Encounter: Payer: Self-pay | Admitting: Physician Assistant

## 2017-04-28 ENCOUNTER — Ambulatory Visit (INDEPENDENT_AMBULATORY_CARE_PROVIDER_SITE_OTHER): Admitting: Physician Assistant

## 2017-04-28 ENCOUNTER — Other Ambulatory Visit

## 2017-04-28 VITALS — BP 101/61 | HR 78 | Temp 97.9°F | Resp 18 | Ht 65.0 in | Wt 145.5 lb

## 2017-04-28 DIAGNOSIS — F411 Generalized anxiety disorder: Secondary | ICD-10-CM

## 2017-04-28 DIAGNOSIS — Z0184 Encounter for antibody response examination: Secondary | ICD-10-CM | POA: Diagnosis not present

## 2017-04-28 MED ORDER — BUPROPION HCL ER (XL) 150 MG PO TB24: 150.0000 mg | ORAL_TABLET | Freq: Every day | ORAL | 1 refills | Status: DC

## 2017-04-28 NOTE — Progress Notes (Signed)
Patient presents to clinic today for follow-up of generalized anxiety disorder with social anxiety. Patient is currently on Zoloft 50 mg daily. Is taking as directed. Anxiety significantly improved, especially social anxiety. Denies any breakthrough anxiety. Denies depressed mood, SI/HI or anhedonia. Is eating and resting well. Still noting decreased libido and delayed orgasm with medications. Wants to discuss addition of Wellbutrin.   Past Medical History:  Diagnosis Date  . Anxiety   . History of chicken pox   . IBS (irritable bowel syndrome)     Current Outpatient Prescriptions on File Prior to Visit  Medication Sig Dispense Refill  . Multiple Vitamin (MULTIVITAMIN) tablet Take 1 tablet by mouth daily.    . norethindrone-ethinyl estradiol (JUNEL FE,GILDESS FE,LOESTRIN FE) 1-20 MG-MCG tablet Take 1 tablet by mouth daily.    . sertraline (ZOLOFT) 25 MG tablet TAKE 1 TABLET(25 MG) BY MOUTH TWICE DAILY 60 tablet 3   No current facility-administered medications on file prior to visit.     No Known Allergies  Family History  Problem Relation Age of Onset  . Hypertension Father        Living  . Obesity Father   . Heart disease Paternal Grandfather   . Diabetes Paternal Grandfather   . Hyperlipidemia Paternal Grandfather   . Heart attack Paternal Grandfather   . Hypertension Paternal Grandfather   . Breast cancer Paternal Grandmother   . Urolithiasis Mother        Living  . Skin cancer Maternal Grandmother   . Renal Disease Maternal Grandfather   . Bipolar disorder Maternal Aunt   . Healthy Brother        x1    Social History   Social History  . Marital status: Single    Spouse name: N/A  . Number of children: N/A  . Years of education: N/A   Social History Main Topics  . Smoking status: Never Smoker  . Smokeless tobacco: Never Used  . Alcohol use Yes     Comment: social  . Drug use: No  . Sexual activity: Yes   Other Topics Concern  . None   Social History  Narrative   Lives in [place] with mother.    Review of Systems - See HPI.  All other ROS are negative.  BP 101/61   Pulse 78   Temp 97.9 F (36.6 C) (Oral)   Resp 18   Ht 5\' 5"  (1.651 m)   Wt 145 lb 8 oz (66 kg)   SpO2 98%   BMI 24.21 kg/m   Physical Exam  Constitutional: She is well-developed, well-nourished, and in no distress.  HENT:  Head: Normocephalic and atraumatic.  Cardiovascular: Normal rate, regular rhythm, normal heart sounds and intact distal pulses.   Pulmonary/Chest: Effort normal and breath sounds normal. No respiratory distress. She has no wheezes. She has no rales. She exhibits no tenderness.  Neurological: She is alert.  Skin: Skin is warm and dry. No rash noted.  Psychiatric: Affect normal.  Vitals reviewed.   Recent Results (from the past 2160 hour(s))  Vitamin D 1,25 dihydroxy     Status: Abnormal   Collection Time: 02/03/17  2:15 PM  Result Value Ref Range   Vitamin D 1, 25 (OH)2 Total 76 (H) 18 - 72 pg/mL   Vitamin D3 1, 25 (OH)2 76 pg/mL   Vitamin D2 1, 25 (OH)2 <8 pg/mL    Comment: Vitamin D3, 1,25(OH)2 indicates both endogenous production and supplementation.  Vitamin D2, 1,25(OH)2 is an indicator  of exogeous sources, such as diet or supplementation.  Interpretation and therapy are based on measurement of Vitamin D,1,25(OH)2, Total. This test was developed and its analytical performance characteristics have been determined by [facility], [place], [place]. It has not been cleared or approved by the FDA. This assay has been validated pursuant to the CLIA regulations and is used for clinical purposes.   CBC     Status: None   Collection Time: 02/03/17  3:15 PM  Result Value Ref Range   WBC 7.5 4.0 - 10.5 K/uL   RBC 4.81 3.87 - 5.11 Mil/uL   Platelets 222.0 150.0 - 400.0 K/uL   Hemoglobin 14.9 12.0 - 15.0 g/dL   HCT 16.1 09.6 - 04.5 %   MCV 89.6 78.0 - 100.0 fl   MCHC 34.6 30.0 - 36.0 g/dL   RDW 40.9 81.1 - 91.4 %  TSH      Status: None   Collection Time: 02/03/17  3:15 PM  Result Value Ref Range   TSH 0.60 0.35 - 4.50 uIU/mL  B12     Status: None   Collection Time: 02/03/17  3:15 PM  Result Value Ref Range   Vitamin B-12 299 211 - 911 pg/mL    Assessment/Plan: Generalized anxiety disorder Doing very well overall. Sexual side effect with Sertraline. Does not want to switch giving how well she is doing otherwise. Will add-on Wellbutrin XL 150 mg daily to see if this can help mitigate sexual side effects. Follow-up scheduled. Alarm signs/symptoms reviewed that would prompt immediate return or ER evaluation. Patient voiced understanding and agreement with the plan.     Piedad Climes, PA-C

## 2017-04-28 NOTE — Progress Notes (Signed)
Pre visit review using our clinic review tool, if applicable. No additional management support is needed unless otherwise documented below in the visit note. 

## 2017-04-28 NOTE — Patient Instructions (Addendum)
Continue the Sertraline as directed. Start the Wellbutrin. Let me know how this works in reference to sexual side effects.  If no improvement, we may want to try Trintellix instead

## 2017-04-29 NOTE — Assessment & Plan Note (Signed)
Doing very well overall. Sexual side effect with Sertraline. Does not want to switch giving how well she is doing otherwise. Will add-on Wellbutrin XL 150 mg daily to see if this can help mitigate sexual side effects. Follow-up scheduled. Alarm signs/symptoms reviewed that would prompt immediate return or ER evaluation. Patient voiced understanding and agreement with the plan.

## 2017-05-01 LAB — QUANTIFERON TB GOLD ASSAY (BLOOD)
INTERFERON GAMMA RELEASE ASSAY: NEGATIVE
Mitogen-Nil: 9 IU/mL
QUANTIFERON NIL VALUE: 0.06 [IU]/mL
Quantiferon Tb Ag Minus Nil Value: 0.02 IU/mL

## 2017-05-30 ENCOUNTER — Telehealth: Payer: Self-pay | Admitting: Physician Assistant

## 2017-05-30 MED ORDER — BUPROPION HCL ER (XL) 150 MG PO TB24: 150.0000 mg | ORAL_TABLET | Freq: Every day | ORAL | 3 refills | Status: DC

## 2017-05-30 NOTE — Telephone Encounter (Signed)
Rx sent to the preferred patient pharmacy Patient notified medication sent to the pharmacy

## 2017-05-30 NOTE — Telephone Encounter (Signed)
Pt states that she needs a refill on buPROPion, [facility] in [place], pt states that this is working for her.

## 2017-06-08 ENCOUNTER — Encounter: Payer: Self-pay | Admitting: Physician Assistant

## 2017-06-17 ENCOUNTER — Encounter: Payer: Self-pay | Admitting: Physician Assistant

## 2017-06-19 ENCOUNTER — Other Ambulatory Visit: Payer: Self-pay | Admitting: Physician Assistant

## 2017-06-19 MED ORDER — VILAZODONE HCL 20 MG PO TABS: ORAL_TABLET | ORAL | 1 refills | Status: DC

## 2017-06-19 MED ORDER — BUPROPION HCL ER (XL) 150 MG PO TB24
150.0000 mg | ORAL_TABLET | Freq: Every day | ORAL | 3 refills | Status: DC
Start: 2017-06-19 — End: 2017-07-12

## 2017-06-19 NOTE — Addendum Note (Signed)
Addended by: Waldon MerlMARTIN, Magan Winnett C on: 06/19/2017 02:37 PM   Modules accepted: Orders

## 2017-07-10 ENCOUNTER — Other Ambulatory Visit: Payer: Self-pay | Admitting: Physician Assistant

## 2017-07-10 DIAGNOSIS — F411 Generalized anxiety disorder: Secondary | ICD-10-CM

## 2017-07-10 NOTE — Telephone Encounter (Signed)
Unsure if patient is to continue the Sertraline or was d/c for the Viibryd and Bupropion. Please advise

## 2017-07-11 NOTE — Telephone Encounter (Signed)
Giving recurrent issue with sexual side effects even despite augmentation with Wellbutrin, would recommend consider switching to Vortioxetine (Trintellix). Would likely have to get approved from insurance. If she is willing we can start a PA for the medication and then once approved, make a switch.

## 2017-07-11 NOTE — Telephone Encounter (Signed)
Spoke with patient and she is only taking the Viibryid for anxiety. She states her sexual side effects have not improved. Her anxiety is not as controlled. Viibryid is not controlling anxiety as well as the Sertraline. She did stop the Bupropion as well.  Please advise

## 2017-07-11 NOTE — Telephone Encounter (Signed)
Refill refused. Patient should be on Viibryd. Please call patient to assess. Thank you.

## 2017-07-12 MED ORDER — VORTIOXETINE HBR 20 MG PO TABS: ORAL_TABLET | ORAL | 1 refills | Status: DC

## 2017-07-12 NOTE — Addendum Note (Signed)
Addended by: Con MemosMOORE, Crystin Lechtenberg S on: 07/12/2017 02:53 PM   Modules accepted: Orders

## 2017-07-12 NOTE — Telephone Encounter (Signed)
Advised patient since she is still having sexual side effects with Viibryd. Will switch to Trintellix 20 mg. Will get approval thru insurance. Rx sent into pharmacy.

## 2017-07-17 ENCOUNTER — Telehealth: Payer: Self-pay | Admitting: Physician Assistant

## 2017-07-17 NOTE — Telephone Encounter (Signed)
Pt has been called and will come by and pick up coupon.

## 2017-07-17 NOTE — Telephone Encounter (Signed)
I have a voucher coupon for her to pick up that will hopefully make $10.00 per month. Will place up front with her name on it.

## 2017-07-17 NOTE — Telephone Encounter (Signed)
Pt states that when she went to p/u Rx trintellix that is was over $100.00 and asking if something else could be called in for her.

## 2017-07-18 ENCOUNTER — Telehealth: Payer: Self-pay | Admitting: *Deleted

## 2017-07-18 MED ORDER — VENLAFAXINE HCL ER 37.5 MG PO CP24: 37.5000 mg | ORAL_CAPSULE | Freq: Every day | ORAL | 1 refills | Status: DC

## 2017-07-18 NOTE — Telephone Encounter (Signed)
Patient states that she was given coupons for Trintellix, but her insurance [employer]) does not allow her to use coupons for this medication, so she cannot afford it.  She states that she has a couple days left of the VIIBRYD, but she will need a refill if she has to stay on this medication.    Since this one has not really worked effectively, she was not sure if there was another medication she could try.

## 2017-07-18 NOTE — Telephone Encounter (Signed)
Patient would like to think about it then call back to let us know. She is worried about weight gain, so she is unsure about switching up again.   She will call back to discuss.

## 2017-07-18 NOTE — Telephone Encounter (Signed)
Patient is asking if she can try the Effexor ER.  [facility] uses [facility] [address]

## 2017-07-18 NOTE — Telephone Encounter (Signed)
Would recommend change to either low-dose Paxil or consider a medication in a different class like Effexor or Pristiq. Is she ok with trying Paxil first?

## 2017-07-18 NOTE — Telephone Encounter (Signed)
Noted  

## 2017-07-18 NOTE — Telephone Encounter (Signed)
I have sent in Rx Effexor XR for her to take once daily instead of the Viibryd. Follow-up 4 weeks.

## 2017-07-18 NOTE — Telephone Encounter (Signed)
Left message for patient to call back to discuss.

## 2017-07-18 NOTE — Addendum Note (Signed)
Addended by: Waldon Merl on: 07/18/2017 02:11 PM   Modules accepted: Orders

## 2017-07-19 NOTE — Telephone Encounter (Signed)
Left message for patient to call to schedule 1 month follow-up on new medication.

## 2017-08-14 ENCOUNTER — Ambulatory Visit (INDEPENDENT_AMBULATORY_CARE_PROVIDER_SITE_OTHER): Admitting: Physician Assistant

## 2017-08-14 ENCOUNTER — Encounter: Payer: Self-pay | Admitting: Physician Assistant

## 2017-08-14 DIAGNOSIS — F411 Generalized anxiety disorder: Secondary | ICD-10-CM | POA: Diagnosis not present

## 2017-08-14 MED ORDER — VENLAFAXINE HCL ER 75 MG PO CP24: 75.0000 mg | ORAL_CAPSULE | Freq: Every day | ORAL | 5 refills | Status: DC

## 2017-08-14 NOTE — Progress Notes (Signed)
Pre visit review using our clinic review tool, if applicable. No additional management support is needed unless otherwise documented below in the visit note. 

## 2017-08-14 NOTE — Progress Notes (Signed)
   Patient presents to clinic today for follow-up of anxiety. Since last visit, due to sexual side effects, patient has been switched from various regimens. Most recently, started on Effexor XR 37.5 mg daily. Is taking as directed and noting improvement in anxiety. Denies any sexual side effects with this medication regimen. Denies SI/HI. Would like to discuss  Increased dose of medication.  Past Medical History:  Diagnosis Date  . Anxiety   . History of chicken pox   . IBS (irritable bowel syndrome)     Current Outpatient Prescriptions on File Prior to Visit  Medication Sig Dispense Refill  . Multiple Vitamin (MULTIVITAMIN) tablet Take 1 tablet by mouth daily.    . norethindrone-ethinyl estradiol (JUNEL FE,GILDESS FE,LOESTRIN FE) 1-20 MG-MCG tablet Take 1 tablet by mouth daily.     No current facility-administered medications on file prior to visit.     No Known Allergies  Family History  Problem Relation Age of Onset  . Hypertension Father        Living  . Obesity Father   . Heart disease Paternal Grandfather   . Diabetes Paternal Grandfather   . Hyperlipidemia Paternal Grandfather   . Heart attack Paternal Grandfather   . Hypertension Paternal Grandfather   . Breast cancer Paternal Grandmother   . Urolithiasis Mother        Living  . Skin cancer Maternal Grandmother   . Renal Disease Maternal Grandfather   . Bipolar disorder Maternal Aunt   . Healthy Brother        x1    Social History   Social History  . Marital status: Single    Spouse name: N/A  . Number of children: N/A  . Years of education: N/A   Social History Main Topics  . Smoking status: Never Smoker  . Smokeless tobacco: Never Used  . Alcohol use Yes     Comment: social  . Drug use: No  . Sexual activity: Yes   Other Topics Concern  . None   Social History Narrative   Lives in [place] with mother.    Review of Systems - See HPI.  All other ROS are negative.  BP 102/70   Pulse 81   Temp 97.7  F (36.5 C) (Oral)   Resp 14   Ht  (1.651 m)   Wt 148 lb (67.1 kg)   SpO2 98%   BMI 24.63 kg/m   Physical Exam  Constitutional: She is oriented to person, place, and time and well-developed, well-nourished, and in no distress.  HENT:  Head: Normocephalic and atraumatic.  Eyes: Conjunctivae are normal.  Neck: Neck supple.  Cardiovascular: Normal rate, regular rhythm, normal heart sounds and intact distal pulses.   Pulmonary/Chest: Effort normal and breath sounds normal. No respiratory distress. She has no wheezes. She has no rales. She exhibits no tenderness.  Neurological: She is alert and oriented to person, place, and time.  Skin: Skin is warm and dry. No rash noted.  Vitals reviewed.  Assessment/Plan: Generalized anxiety disorder Improved with Effexor. Will increase to 75 mg daily. Follow-up 3 months.    Piedad Climes, PA-C

## 2017-08-14 NOTE — Assessment & Plan Note (Signed)
Improved with Effexor. Will increase to 75 mg daily. Follow-up 3 months.

## 2017-08-14 NOTE — Patient Instructions (Signed)
Please start the increased dose of the Effexor. Let me know how this does for you. If things are much improved, we will follow-up in 3 months. Return sooner if needed.  Start a PRN over-the-counter Flonase to help with the intermittent popping/pressure in the ear that is coming from eustachian tube dysfunction.

## 2017-11-13 ENCOUNTER — Ambulatory Visit: Admitting: Physician Assistant

## 2017-11-14 ENCOUNTER — Ambulatory Visit: Admitting: Physician Assistant

## 2017-11-14 NOTE — Progress Notes (Signed)
Patient presents to clinic today for 2753-month follow-up of anxiety. Patient is currently on a regimen of Effexor XR 75 mg daily. Is taking as directed. Endorses mood is doing very well with current regimen. Denies side effect. Denies SI/HI. 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 At that time she and her husband want to start trying for children so she would like to reassess medication before they start.   Past Medical History:  Diagnosis Date  . Anxiety   . History of chicken pox   . IBS (irritable bowel syndrome)     Current Outpatient Medications on File Prior to Visit  Medication Sig Dispense Refill  . Multiple Vitamin (MULTIVITAMIN) tablet Take 1 tablet by mouth daily.    . naproxen (NAPROSYN) 500 MG tablet Take 500 mg by mouth 2 (two) times daily with a meal. For menstrual cramps    . norethindrone-ethinyl estradiol (JUNEL FE,GILDESS FE,LOESTRIN FE) 1-20 MG-MCG tablet Take 1 tablet by mouth daily.    Marland Kitchen. venlafaxine XR (EFFEXOR XR) 75 MG 24 hr capsule Take 1 capsule (75 mg total) by mouth daily with breakfast. 30 capsule 5   No current facility-administered medications on file prior to visit.     No Known Allergies  Family History  Problem Relation Age of Onset  . Hypertension Father        Living  . Obesity Father   . Heart disease Paternal Grandfather   . Diabetes Paternal Grandfather   . Hyperlipidemia Paternal Grandfather   . Heart attack Paternal Grandfather   . Hypertension Paternal Grandfather   . Breast cancer Paternal Grandmother   . Urolithiasis Mother        Living  . Skin cancer Maternal Grandmother   . Renal Disease Maternal Grandfather   . Bipolar disorder Maternal Aunt   . Healthy Brother        x1    Social History   Socioeconomic History  . Marital status: Single    Spouse name: None  . Number of children: None  . Years of education: None  . Highest education level: None  Social Needs  . Financial resource strain: None  .  Food insecurity - worry: None  . Food insecurity - inability: None  . Transportation needs - medical: None  . Transportation needs - non-medical: None  Occupational History  . None  Tobacco Use  . Smoking status: Never Smoker  . Smokeless tobacco: Never Used  Substance and Sexual Activity  . Alcohol use: Yes    Comment: social  . Drug use: No  . Sexual activity: Yes  Other Topics Concern  . None  Social History Narrative   [place]    Review of Systems - See HPI.  All other ROS are negative.  BP 110/78   Pulse 76   Temp 98 F (36.7 C) (Oral)   Resp 16   Ht 5\' 5"  (1.651 m)   Wt 152 lb 4 oz (69.1 kg)   SpO2 98%   BMI 25.34 kg/m   Physical Exam  Constitutional: She is oriented to person, place, and time and well-developed, well-nourished, and in no distress.  HENT:  Head: Normocephalic and atraumatic.  Cardiovascular: Normal rate, regular rhythm, normal heart sounds and intact distal pulses.  Pulmonary/Chest: Effort normal and breath sounds normal. No respiratory distress. She has no wheezes. She has no rales. She exhibits no tenderness.  Neurological: She is alert and oriented to person, place,  and time.  Skin: Skin is warm and dry. No rash noted.  Psychiatric: Affect normal.  Vitals reviewed.    Assessment/Plan: Generalized anxiety disorder Doing very well. Will continue current regimen. Follow-up in 6 months. If she decides she and her husband want to try for a child sooner, then she will follow-up earlier to discuss weaning versus changing medication.    Piedad ClimesWilliam Cody Geetika Laborde, PA-C

## 2017-11-15 ENCOUNTER — Other Ambulatory Visit: Payer: Self-pay

## 2017-11-15 ENCOUNTER — Encounter: Payer: Self-pay | Admitting: Physician Assistant

## 2017-11-15 ENCOUNTER — Ambulatory Visit (INDEPENDENT_AMBULATORY_CARE_PROVIDER_SITE_OTHER): Admitting: Physician Assistant

## 2017-11-15 VITALS — BP 110/78 | HR 76 | Temp 98.0°F | Resp 16 | Ht 65.0 in | Wt 152.2 lb

## 2017-11-15 DIAGNOSIS — F411 Generalized anxiety disorder: Secondary | ICD-10-CM | POA: Diagnosis not present

## 2017-11-15 NOTE — Patient Instructions (Signed)
Please continue current medication regimen. I am glad you are doing well. Follow-up with me next Spring when you are done with school and want to consider weaning medication.  Return sooner if needed. Happy Holidays!

## 2017-11-15 NOTE — Assessment & Plan Note (Signed)
Doing very well. Will continue current regimen. Follow-up in 6 months. If she decides she and her husband want to try for a child sooner, then she will follow-up earlier to discuss weaning versus changing medication.

## 2018-02-08 ENCOUNTER — Other Ambulatory Visit: Payer: Self-pay | Admitting: Physician Assistant

## 2018-03-23 ENCOUNTER — Ambulatory Visit (INDEPENDENT_AMBULATORY_CARE_PROVIDER_SITE_OTHER): Admitting: Physician Assistant

## 2018-03-23 ENCOUNTER — Other Ambulatory Visit: Payer: Self-pay

## 2018-03-23 ENCOUNTER — Encounter: Payer: Self-pay | Admitting: Physician Assistant

## 2018-03-23 VITALS — BP 110/80 | HR 102 | Temp 98.8°F | Resp 14 | Ht 65.0 in | Wt 155.0 lb

## 2018-03-23 DIAGNOSIS — F411 Generalized anxiety disorder: Secondary | ICD-10-CM | POA: Diagnosis not present

## 2018-03-23 MED ORDER — VENLAFAXINE HCL ER 37.5 MG PO CP24: 37.5000 mg | ORAL_CAPSULE | Freq: Every day | ORAL | 1 refills | Status: DC

## 2018-03-23 NOTE — Progress Notes (Signed)
Patient presents to clinic today for follow-up of generalized anxiety disorder, currently on Effexor XR 75 mg daily. Has done extremely well on this. She has just finished with school and would like to discuss wean down on medication. She and her husband are wanting to try to have a baby starting in the summer.   Past Medical History:  Diagnosis Date  . Anxiety   . History of chicken pox   . IBS (irritable bowel syndrome)     Current Outpatient Medications on File Prior to Visit  Medication Sig Dispense Refill  . naproxen (NAPROSYN) 500 MG tablet Take 500 mg by mouth 2 (two) times daily with a meal. For menstrual cramps    . norethindrone-ethinyl estradiol (JUNEL FE,GILDESS FE,LOESTRIN FE) 1-20 MG-MCG tablet Take 1 tablet by mouth daily.    . Prenatal Vit-Fe Fumarate-FA (PRENATAL 19) tablet Chew 1 tablet by mouth daily.     No current facility-administered medications on file prior to visit.     No Known Allergies  Family History  Problem Relation Age of Onset  . Hypertension Father        Living  . Obesity Father   . Heart disease Paternal Grandfather   . Diabetes Paternal Grandfather   . Hyperlipidemia Paternal Grandfather   . Heart attack Paternal Grandfather   . Hypertension Paternal Grandfather   . Breast cancer Paternal Grandmother   . Urolithiasis Mother        Living  . Skin cancer Maternal Grandmother   . Renal Disease Maternal Grandfather   . Bipolar disorder Maternal Aunt   . Healthy Brother        x1    Social History   Socioeconomic History  . Marital status: Single    Spouse name: Not on file  . Number of children: Not on file  . Years of education: Not on file  . Highest education level: Not on file  Occupational History  . Not on file  Social Needs  . Financial resource strain: Not on file  . Food insecurity:    Worry: Not on file    Inability: Not on file  . Transportation needs:    Medical: Not on file    Non-medical: Not on file    Tobacco Use  . Smoking status: Never Smoker  . Smokeless tobacco: Never Used  Substance and Sexual Activity  . Alcohol use: Yes    Comment: social  . Drug use: No  . Sexual activity: Yes  Lifestyle  . Physical activity:    Days per week: Not on file    Minutes per session: Not on file  . Stress: Not on file  Relationships  . Social connections:    Talks on phone: Not on file    Gets together: Not on file    Attends religious service: Not on file    Active member of club or organization: Not on file    Attends meetings of clubs or organizations: Not on file    Relationship status: Not on file  Other Topics Concern  . Not on file  Social History Narrative   [place]    Review of Systems - See HPI.  All other ROS are negative.  BP 110/80   Pulse (!) 102   Temp 98.8 F (37.1 C) (Oral)   Resp 14   Ht 5\' 5"  (1.651 m)   Wt 155 lb (70.3 kg)   SpO2 99%   BMI 25.79 kg/m  Physical Exam  Constitutional: She appears well-developed and well-nourished.  Eyes: Conjunctivae are normal.  Cardiovascular: Normal rate, regular rhythm and normal heart sounds.  Pulmonary/Chest: Effort normal.  Skin: Skin is warm.  Psychiatric: She has a normal mood and affect.  Vitals reviewed.  Assessment/Plan: Generalized anxiety disorder We will start to wean down the Effexor XR.  For the next week, alternate 75 mg one day with 37.5 mg the next.  If tolerating well, then decrease to 37.5 mg daily x 2 weeks. After that, decrease to 37.5 mg every other day for 1 week before stopping.      Piedad Climes, PA-C

## 2018-03-23 NOTE — Assessment & Plan Note (Signed)
We will start to wean down the Effexor XR.  For the next week, alternate 75 mg one day with 37.5 mg the next.  If tolerating well, then decrease to 37.5 mg daily x 2 weeks. After that, decrease to 37.5 mg every other day for 1 week before stopping.

## 2018-03-23 NOTE — Patient Instructions (Signed)
I am glad you are doing so well! Congratulations on your upcoming graduation!  We can start to wean down the Effexor XR.  For the next week, alternate 75 mg one day with 37.5 mg the next.  If tolerating well, then decrease to 37.5 mg daily x 2 weeks. After that, decrease to 37.5 mg every other day for 1 week before stopping.   Please call me and let me know if you have any issue.

## 2018-07-26 ENCOUNTER — Ambulatory Visit (INDEPENDENT_AMBULATORY_CARE_PROVIDER_SITE_OTHER): Admitting: Physician Assistant

## 2018-07-26 ENCOUNTER — Encounter: Payer: Self-pay | Admitting: Physician Assistant

## 2018-07-26 ENCOUNTER — Other Ambulatory Visit: Payer: Self-pay

## 2018-07-26 VITALS — BP 100/70 | HR 72 | Temp 98.2°F | Resp 14 | Ht 65.0 in | Wt 155.0 lb

## 2018-07-26 DIAGNOSIS — F411 Generalized anxiety disorder: Secondary | ICD-10-CM | POA: Diagnosis not present

## 2018-07-26 LAB — POCT URINE PREGNANCY: Preg Test, Ur: NEGATIVE

## 2018-07-26 MED ORDER — FLUOXETINE HCL 20 MG PO TABS: 20.0000 mg | ORAL_TABLET | Freq: Every day | ORAL | 1 refills | Status: DC

## 2018-07-26 NOTE — Assessment & Plan Note (Signed)
Attempting to conceive. Urine pregnancy negative. Will start low-dose Prozac at 20 mg daily. Follow-up scheduled.

## 2018-07-26 NOTE — Patient Instructions (Signed)
Please start the Fluoxetine, taking daily as directed. Limit NSAID use while on this medication -- rare use is fine but regular use is associated with change in how the Fluoxetine works. Follow-up with me in 6-8 weeks for reassessment. Message me in a couple of weeks to let me know how things are going.

## 2018-07-26 NOTE — Progress Notes (Signed)
Patient presents to clinic today c/o recurrence of anxiety. Has long-standing history of genreal anxiety disorder and social anxiety. Was most recently on Effexor which she did very well on. Was weaned of of this earlier this year as she and her husband wanted to work on conceiving. She had also graduated school and stressors were very minimal. Notes she has been doing very well up until this last month. Work stressors are causing significant anxiety. Denies depressed mood or anhedonia. 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   Past Medical History:  Diagnosis Date  . Anxiety   . History of chicken pox   . IBS (irritable bowel syndrome)     Current Outpatient Medications on File Prior to Visit  Medication Sig Dispense Refill  . Prenatal Vit-Fe Fumarate-FA (PRENATAL 19) tablet Chew 1 tablet by mouth daily.     No current facility-administered medications on file prior to visit.     No Known Allergies  Family History  Problem Relation Age of Onset  . Hypertension Father        Living  . Obesity Father   . Heart disease Paternal Grandfather   . Diabetes Paternal Grandfather   . Hyperlipidemia Paternal Grandfather   . Heart attack Paternal Grandfather   . Hypertension Paternal Grandfather   . Breast cancer Paternal Grandmother   . Urolithiasis Mother        Living  . Skin cancer Maternal Grandmother   . Renal Disease Maternal Grandfather   . Bipolar disorder Maternal Aunt   . Healthy Brother        x1    Social History   Socioeconomic History  . Marital status: Married    Spouse name: Not on file  . Number of children: Not on file  . Years of education: Not on file  . Highest education level: Not on file  Occupational History  . Occupation: NP  Social Needs  . Financial resource strain: Not on file  . Food insecurity:    Worry: Not on file    Inability: Not on file  . Transportation needs:    Medical: Not on file    Non-medical: Not on  file  Tobacco Use  . Smoking status: Never Smoker  . Smokeless tobacco: Never Used  Substance and Sexual Activity  . Alcohol use: Yes    Comment: social  . Drug use: No  . Sexual activity: Yes  Lifestyle  . Physical activity:    Days per week: Not on file    Minutes per session: Not on file  . Stress: Not on file  Relationships  . Social connections:    Talks on phone: Not on file    Gets together: Not on file    Attends religious service: Not on file    Active member of club or organization: Not on file    Attends meetings of clubs or organizations: Not on file    Relationship status: Not on file  Other Topics Concern  . Not on file  Social History Narrative  . Not on file   Review of Systems - See HPI.  All other ROS are negative.  BP 100/70   Pulse 72   Temp 98.2 F (36.8 C) (Oral)   Resp 14   Ht 5\' 5"  (1.651 m)   Wt 155 lb (70.3 kg)   SpO2 99%   BMI 25.79 kg/m   Physical Exam  Constitutional: She appears well-developed and well-nourished.  HENT:  Head: Normocephalic and atraumatic.  Eyes: Conjunctivae are normal.  Cardiovascular: Normal rate, regular rhythm, normal heart sounds and intact distal pulses.  Pulmonary/Chest: Effort normal.  Psychiatric: She has a normal mood and affect.  Vitals reviewed.  Assessment/Plan: Generalized anxiety disorder Attempting to conceive. Urine pregnancy negative. Will start low-dose Prozac at 20 mg daily. Follow-up scheduled.     Piedad Climes, PA-C

## 2018-09-09 ENCOUNTER — Encounter: Payer: Self-pay | Admitting: Physician Assistant

## 2018-09-09 DIAGNOSIS — F411 Generalized anxiety disorder: Secondary | ICD-10-CM

## 2018-09-10 MED ORDER — FLUOXETINE HCL 20 MG PO TABS: 20.0000 mg | ORAL_TABLET | Freq: Every day | ORAL | 1 refills | Status: DC

## 2018-09-20 ENCOUNTER — Other Ambulatory Visit: Payer: Self-pay | Admitting: Physician Assistant

## 2018-09-20 DIAGNOSIS — F411 Generalized anxiety disorder: Secondary | ICD-10-CM

## 2018-10-02 LAB — OB RESULTS CONSOLE RPR: RPR: NONREACTIVE

## 2018-10-02 LAB — OB RESULTS CONSOLE GC/CHLAMYDIA
Chlamydia: NEGATIVE
Gonorrhea: NEGATIVE

## 2018-10-02 LAB — OB RESULTS CONSOLE HIV ANTIBODY (ROUTINE TESTING): HIV: NONREACTIVE

## 2018-10-02 LAB — OB RESULTS CONSOLE ABO/RH: RH Type: POSITIVE

## 2018-10-02 LAB — OB RESULTS CONSOLE RUBELLA ANTIBODY, IGM: Rubella: IMMUNE

## 2018-10-02 LAB — OB RESULTS CONSOLE HEPATITIS B SURFACE ANTIGEN: Hepatitis B Surface Ag: NEGATIVE

## 2018-10-02 LAB — OB RESULTS CONSOLE ANTIBODY SCREEN: Antibody Screen: NEGATIVE

## 2018-12-06 ENCOUNTER — Encounter: Payer: Self-pay | Admitting: Physician Assistant

## 2019-01-01 ENCOUNTER — Ambulatory Visit (INDEPENDENT_AMBULATORY_CARE_PROVIDER_SITE_OTHER): Admitting: Physician Assistant

## 2019-01-01 ENCOUNTER — Encounter: Payer: Self-pay | Admitting: Physician Assistant

## 2019-01-01 ENCOUNTER — Other Ambulatory Visit: Payer: Self-pay

## 2019-01-01 VITALS — BP 100/60 | HR 75 | Temp 98.8°F | Resp 16 | Ht 65.0 in | Wt 160.0 lb

## 2019-01-01 DIAGNOSIS — J069 Acute upper respiratory infection, unspecified: Secondary | ICD-10-CM | POA: Diagnosis not present

## 2019-01-01 LAB — POC INFLUENZA A&B (BINAX/QUICKVUE)
Influenza A, POC: NEGATIVE
Influenza B, POC: NEGATIVE

## 2019-01-01 NOTE — Patient Instructions (Signed)
Please keep well-hydrated and get plenty of rest. Start a saline nasal rinse 1-2 x day. Put a humidifier in the bedroom. Start salt-water gargles.   Call if symptoms are not easing up in the next few days.   Guiafenesin (Robitussin)  Guiafenesin plus dextromethorphan (Robitussin-DM)  Cough drops  Vicks Vaporub  Acetaminophen

## 2019-01-01 NOTE — Progress Notes (Signed)
Patient presents to clinic today c/o 2 days of sore throat, nasal congestion, PND and fatigue. Denies fever, chills, aches. Denies recent travel or known sick contact. Has not taken anything for symptoms. She is currently [redacted] weeks pregnant. .   Past Medical History:  Diagnosis Date  . Anxiety   . History of chicken pox   . IBS (irritable bowel syndrome)     Current Outpatient Medications on File Prior to Visit  Medication Sig Dispense Refill  . FLUoxetine (PROZAC) 20 MG tablet TAKE 1 TABLET(20 MG) BY MOUTH DAILY 30 tablet 0  . Prenatal Vit-Fe Fumarate-FA (PRENATAL 19) tablet Chew 1 tablet by mouth daily.     No current facility-administered medications on file prior to visit.     No Known Allergies  Family History  Problem Relation Age of Onset  . Hypertension Father        Living  . Obesity Father   . Heart disease Paternal Grandfather   . Diabetes Paternal Grandfather   . Hyperlipidemia Paternal Grandfather   . Heart attack Paternal Grandfather   . Hypertension Paternal Grandfather   . Breast cancer Paternal Grandmother   . Urolithiasis Mother        Living  . Skin cancer Maternal Grandmother   . Renal Disease Maternal Grandfather   . Bipolar disorder Maternal Aunt   . Healthy Brother        x1    Social History   Socioeconomic History  . Marital status: Married    Spouse name: Not on file  . Number of children: Not on file  . Years of education: Not on file  . Highest education level: Not on file  Occupational History  . Occupation: NP  Social Needs  . Financial resource strain: Not on file  . Food insecurity:    Worry: Not on file    Inability: Not on file  . Transportation needs:    Medical: Not on file    Non-medical: Not on file  Tobacco Use  . Smoking status: Never Smoker  . Smokeless tobacco: Never Used  Substance and Sexual Activity  . Alcohol use: Yes    Comment: social  . Drug use: No  . Sexual activity: Yes  Lifestyle  . Physical  activity:    Days per week: Not on file    Minutes per session: Not on file  . Stress: Not on file  Relationships  . Social connections:    Talks on phone: Not on file    Gets together: Not on file    Attends religious service: Not on file    Active member of club or organization: Not on file    Attends meetings of clubs or organizations: Not on file    Relationship status: Not on file  Other Topics Concern  . Not on file  Social History Narrative  . Not on file   Review of Systems - See HPI.  All other ROS are negative.  BP 100/60   Pulse 75   Temp 98.8 F (37.1 C) (Oral)   Resp 16   Ht 5\' 5"  (1.651 m)   Wt 160 lb (72.6 kg)   SpO2 98%   BMI 26.63 kg/m   Physical Exam Vitals signs reviewed.  Constitutional:      General: She is not in acute distress.    Appearance: She is well-developed. She is not ill-appearing.  HENT:     Head: Normocephalic and atraumatic.  Right Ear: Tympanic membrane normal.     Left Ear: Tympanic membrane normal.     Nose: Congestion and rhinorrhea present.     Mouth/Throat:     Mouth: Mucous membranes are moist. No oral lesions.     Pharynx: Posterior oropharyngeal erythema (very mild) present. No pharyngeal swelling, oropharyngeal exudate or uvula swelling.     Tonsils: No tonsillar exudate.  Eyes:     Conjunctiva/sclera: Conjunctivae normal.  Neck:     Musculoskeletal: Normal range of motion and neck supple.  Cardiovascular:     Rate and Rhythm: Normal rate and regular rhythm.     Heart sounds: Normal heart sounds.  Pulmonary:     Effort: Pulmonary effort is normal.     Breath sounds: Normal breath sounds.  Lymphadenopathy:     Cervical: No cervical adenopathy.  Neurological:     Mental Status: She is alert and oriented to person, place, and time.  Psychiatric:        Mood and Affect: Mood normal.    Assessment/Plan: 1. Viral URI Flu swab negative. Reassurance given to patient. Exam with mild oropharyngeal erythema and nasal  congestion. Viral etiology discussed. Supportive measures and OTC medications safe for pregnancy reviewed. No indication for ABX.    Piedad ClimesWilliam Cody Braxley Balandran, PA-C

## 2019-01-16 ENCOUNTER — Telehealth: Payer: Self-pay | Admitting: Physician Assistant

## 2019-01-16 NOTE — Telephone Encounter (Signed)
Giving pregnancy would recommend she discuss with her OB. It can be used in pregnancy but typically for HSV 2 (t o prevent issue in newborn), starting at [redacted] weeks gestation. Has she used anything topical OTC like Abreva?

## 2019-01-16 NOTE — Telephone Encounter (Signed)
In reviewing patient chart. No medication for Valtrex in her past chart. Patient is pregnant  Please advise

## 2019-01-16 NOTE — Telephone Encounter (Signed)
Copied from CRM [id number]. Topic: General - Other >> Jan 16, 2019  7:50 AM Leafy Ro wrote: Reason for CRM: pt has cold sore on her bottom lip. Pt would like a new rx valtrex send to [facility] on [address]. Pt saw cody on 01-01-2019. Pt is pregnant. Pt would like a callback

## 2019-01-16 NOTE — Telephone Encounter (Signed)
Pt states she has not used any OTC products with this particular cold sore because they have not worked well for her in the past.  She states she will call her OB and discuss treatment options with them.

## 2019-03-20 ENCOUNTER — Other Ambulatory Visit: Payer: Self-pay | Admitting: Physician Assistant

## 2019-03-20 ENCOUNTER — Telehealth: Payer: Self-pay | Admitting: Emergency Medicine

## 2019-03-20 ENCOUNTER — Encounter: Payer: Self-pay | Admitting: Emergency Medicine

## 2019-03-20 DIAGNOSIS — F411 Generalized anxiety disorder: Secondary | ICD-10-CM

## 2019-03-20 NOTE — Telephone Encounter (Signed)
LMOVM advising patient to call back to schedule an appointment for Anxiety to refill medication

## 2019-03-20 NOTE — Telephone Encounter (Signed)
LMOVM advising patient to call back to schedule an virtual visit for Anxiety/Depression/medication refill.

## 2019-03-20 NOTE — Telephone Encounter (Signed)
Patient is due for follow up for Anxiety/Depression

## 2019-03-20 NOTE — Telephone Encounter (Signed)
Patient says her ob/gyn is taking care of filling the fluoxetine so this can be cancelled. Thank very much she says.

## 2019-05-06 ENCOUNTER — Telehealth (HOSPITAL_COMMUNITY): Payer: Self-pay | Admitting: *Deleted

## 2019-05-06 ENCOUNTER — Encounter (HOSPITAL_COMMUNITY): Payer: Self-pay | Admitting: *Deleted

## 2019-05-06 NOTE — Telephone Encounter (Signed)
Preadmission screen  

## 2019-05-07 ENCOUNTER — Encounter (HOSPITAL_COMMUNITY): Payer: Self-pay

## 2019-05-07 ENCOUNTER — Telehealth (HOSPITAL_COMMUNITY): Payer: Self-pay

## 2019-05-14 ENCOUNTER — Other Ambulatory Visit: Payer: Self-pay

## 2019-05-14 ENCOUNTER — Other Ambulatory Visit (HOSPITAL_COMMUNITY)
Admission: RE | Admit: 2019-05-14 | Discharge: 2019-05-14 | Disposition: A | Discharge status: Home / Self Care | Source: Ambulatory Visit | Attending: Obstetrics & Gynecology | Admitting: Obstetrics & Gynecology

## 2019-05-14 ENCOUNTER — Other Ambulatory Visit: Payer: Self-pay | Admitting: Obstetrics

## 2019-05-14 DIAGNOSIS — Z1159 Encounter for screening for other viral diseases: Secondary | ICD-10-CM | POA: Diagnosis present

## 2019-05-14 NOTE — MAU Note (Signed)
Asymptomatic, swab collected without problem. 

## 2019-05-15 ENCOUNTER — Other Ambulatory Visit (HOSPITAL_COMMUNITY): Payer: Self-pay | Admitting: *Deleted

## 2019-05-15 LAB — NOVEL CORONAVIRUS, NAA (HOSP ORDER, SEND-OUT TO REF LAB; TAT 18-24 HRS): SARS-CoV-2, NAA: NOT DETECTED

## 2019-05-16 ENCOUNTER — Inpatient Hospital Stay (HOSPITAL_COMMUNITY)

## 2019-05-19 ENCOUNTER — Encounter (HOSPITAL_COMMUNITY): Payer: Self-pay | Admitting: *Deleted

## 2019-05-19 ENCOUNTER — Other Ambulatory Visit: Payer: Self-pay

## 2019-05-19 ENCOUNTER — Inpatient Hospital Stay (HOSPITAL_COMMUNITY)
Admission: AD | Admit: 2019-05-19 | Discharge: 2019-05-23 | DRG: 787 | Disposition: A | Discharge status: Home / Self Care | Attending: Obstetrics and Gynecology | Admitting: Obstetrics and Gynecology

## 2019-05-19 DIAGNOSIS — O9912 Other diseases of the blood and blood-forming organs and certain disorders involving the immune mechanism complicating childbirth: Secondary | ICD-10-CM | POA: Diagnosis present

## 2019-05-19 DIAGNOSIS — Z3A39 39 weeks gestation of pregnancy: Secondary | ICD-10-CM

## 2019-05-19 DIAGNOSIS — D509 Iron deficiency anemia, unspecified: Secondary | ICD-10-CM | POA: Diagnosis present

## 2019-05-19 DIAGNOSIS — O3663X0 Maternal care for excessive fetal growth, third trimester, not applicable or unspecified: Secondary | ICD-10-CM | POA: Diagnosis present

## 2019-05-19 DIAGNOSIS — D6959 Other secondary thrombocytopenia: Secondary | ICD-10-CM | POA: Diagnosis present

## 2019-05-19 DIAGNOSIS — F419 Anxiety disorder, unspecified: Secondary | ICD-10-CM | POA: Diagnosis present

## 2019-05-19 DIAGNOSIS — O99344 Other mental disorders complicating childbirth: Secondary | ICD-10-CM | POA: Diagnosis present

## 2019-05-19 DIAGNOSIS — E669 Obesity, unspecified: Secondary | ICD-10-CM | POA: Diagnosis present

## 2019-05-19 DIAGNOSIS — O99214 Obesity complicating childbirth: Secondary | ICD-10-CM | POA: Diagnosis present

## 2019-05-19 DIAGNOSIS — F411 Generalized anxiety disorder: Secondary | ICD-10-CM

## 2019-05-19 DIAGNOSIS — D62 Acute posthemorrhagic anemia: Secondary | ICD-10-CM | POA: Diagnosis present

## 2019-05-19 DIAGNOSIS — O26893 Other specified pregnancy related conditions, third trimester: Secondary | ICD-10-CM | POA: Diagnosis present

## 2019-05-19 DIAGNOSIS — O9081 Anemia of the puerperium: Secondary | ICD-10-CM | POA: Diagnosis present

## 2019-05-19 HISTORY — DX: Anemia, unspecified: D64.9

## 2019-05-19 LAB — POCT FERN TEST: POCT Fern Test: POSITIVE

## 2019-05-19 LAB — CBC
HCT: 35.9 % — ABNORMAL LOW (ref 36.0–46.0)
Hemoglobin: 12.3 g/dL (ref 12.0–15.0)
MCH: 31.5 pg (ref 26.0–34.0)
MCHC: 34.3 g/dL (ref 30.0–36.0)
MCV: 91.8 fL (ref 80.0–100.0)
Platelets: 134 10*3/uL — ABNORMAL LOW (ref 150–400)
RBC: 3.91 MIL/uL (ref 3.87–5.11)
RDW: 14.3 % (ref 11.5–15.5)
WBC: 12.8 10*3/uL — ABNORMAL HIGH (ref 4.0–10.5)
nRBC: 0 % (ref 0.0–0.2)

## 2019-05-19 LAB — TYPE AND SCREEN
ABO/RH(D): O POS
Antibody Screen: NEGATIVE

## 2019-05-19 MED ORDER — OXYTOCIN 40 UNITS IN NORMAL SALINE INFUSION - SIMPLE MED
1.0000 m[IU]/min | INTRAVENOUS | Status: DC
  Administered 2019-05-19: 1 m[IU]/min via INTRAVENOUS
  Filled 2019-05-19: qty 1000

## 2019-05-19 MED ORDER — PHENYLEPHRINE 40 MCG/ML (10ML) SYRINGE FOR IV PUSH (FOR BLOOD PRESSURE SUPPORT): 80.0000 ug | PREFILLED_SYRINGE | INTRAVENOUS | Status: DC | PRN

## 2019-05-19 MED ORDER — LACTATED RINGERS IV SOLN: 500.0000 mL | INTRAVENOUS | Status: DC | PRN

## 2019-05-19 MED ORDER — SOD CITRATE-CITRIC ACID 500-334 MG/5ML PO SOLN
30.0000 mL | ORAL | Status: DC | PRN
  Administered 2019-05-20: 30 mL via ORAL
  Filled 2019-05-19: qty 30

## 2019-05-19 MED ORDER — PHENYLEPHRINE 40 MCG/ML (10ML) SYRINGE FOR IV PUSH (FOR BLOOD PRESSURE SUPPORT)
80.0000 ug | PREFILLED_SYRINGE | INTRAVENOUS | Status: DC | PRN
  Filled 2019-05-19: qty 10

## 2019-05-19 MED ORDER — LIDOCAINE HCL (PF) 1 % IJ SOLN: 30.0000 mL | INTRAMUSCULAR | Status: DC | PRN

## 2019-05-19 MED ORDER — EPHEDRINE 5 MG/ML INJ: 10.0000 mg | INTRAVENOUS | Status: DC | PRN

## 2019-05-19 MED ORDER — OXYTOCIN 40 UNITS IN NORMAL SALINE INFUSION - SIMPLE MED
2.5000 [IU]/h | INTRAVENOUS | Status: DC
  Administered 2019-05-20: 2.5 [IU]/h via INTRAVENOUS

## 2019-05-19 MED ORDER — DIPHENHYDRAMINE HCL 50 MG/ML IJ SOLN: 12.5000 mg | INTRAMUSCULAR | Status: DC | PRN

## 2019-05-19 MED ORDER — LACTATED RINGERS IV SOLN: 500.0000 mL | Freq: Once | INTRAVENOUS | Status: DC

## 2019-05-19 MED ORDER — ACETAMINOPHEN 325 MG PO TABS: 650.0000 mg | ORAL_TABLET | ORAL | Status: DC | PRN

## 2019-05-19 MED ORDER — ONDANSETRON HCL 4 MG/2ML IJ SOLN: 4.0000 mg | Freq: Four times a day (QID) | INTRAMUSCULAR | Status: DC | PRN

## 2019-05-19 MED ORDER — OXYTOCIN BOLUS FROM INFUSION: 500.0000 mL | Freq: Once | INTRAVENOUS | Status: DC

## 2019-05-19 MED ORDER — TERBUTALINE SULFATE 1 MG/ML IJ SOLN: 0.2500 mg | Freq: Once | INTRAMUSCULAR | Status: DC | PRN

## 2019-05-19 MED ORDER — FENTANYL-BUPIVACAINE-NACL 0.5-0.125-0.9 MG/250ML-% EP SOLN
12.0000 mL/h | EPIDURAL | Status: DC | PRN
  Filled 2019-05-19: qty 250

## 2019-05-19 MED ORDER — FLEET ENEMA 7-19 GM/118ML RE ENEM: 1.0000 | ENEMA | RECTAL | Status: DC | PRN

## 2019-05-19 MED ORDER — OXYCODONE-ACETAMINOPHEN 5-325 MG PO TABS: 1.0000 | ORAL_TABLET | ORAL | Status: DC | PRN

## 2019-05-19 MED ORDER — LACTATED RINGERS IV SOLN
INTRAVENOUS | Status: DC
  Administered 2019-05-19 – 2019-05-20 (×4): via INTRAVENOUS

## 2019-05-19 MED ORDER — OXYCODONE-ACETAMINOPHEN 5-325 MG PO TABS: 2.0000 | ORAL_TABLET | ORAL | Status: DC | PRN

## 2019-05-19 NOTE — MAU Note (Signed)
Pt arrived in MAU with c/o water breaking around 1730 this evening. No vag bleeding or contractions.

## 2019-05-19 NOTE — Plan of Care (Signed)

## 2019-05-19 NOTE — H&P (Signed)
Dana Serrano is a 28 y.o. female presenting for term SROM with contractions. OB History    Gravida  1   Para      Term      Preterm      AB      Living        SAB      TAB      Ectopic      Multiple      Live Births             Past Medical History:  Diagnosis Date  . Anemia   . Anxiety   . History of chicken pox   . HSV (herpes simplex virus) anogenital infection   . IBS (irritable bowel syndrome)    Past Surgical History:  Procedure Laterality Date  . BREAST ENHANCEMENT SURGERY     2013  . TONSILLECTOMY     2009  . WISDOM TOOTH EXTRACTION     08.2015   Family History: family history includes Alcohol abuse in her father and mother; Alcoholism in her father and mother; Bipolar disorder in her maternal aunt; Breast cancer in her paternal grandmother; COPD in her paternal grandfather; Cancer in her maternal grandmother; Depression in her paternal grandmother; Diabetes in her paternal grandfather; Drug abuse in her paternal uncle; Early death in her paternal uncle; Healthy in her brother; Heart attack in her paternal grandfather; Heart disease in her paternal grandfather; Hyperlipidemia in her father and paternal grandfather; Hypertension in her father and paternal grandfather; Obesity in her father; Renal Disease in her maternal grandfather; Skin cancer in her maternal grandmother. Social History:  reports that she has never smoked. She has never used smokeless tobacco. She reports current alcohol use. She reports that she does not use drugs.     Maternal Diabetes: No Genetic Screening: Normal Maternal Ultrasounds/Referrals: Normal Fetal Ultrasounds or other Referrals:  None Maternal Substance Abuse:  No Significant Maternal Medications:  None Significant Maternal Lab Results:  Group B Strep negative Other Comments:  None  Review of Systems  Constitutional: Negative.   All other systems reviewed and are negative.  Maternal Medical History:  Reason  for admission: Rupture of membranes and contractions.   Contractions: Onset was 1-2 hours ago.   Frequency: irregular.   Perceived severity is moderate.    Fetal activity: Perceived fetal activity is normal.   Last perceived fetal movement was within the past hour.    Prenatal complications: no prenatal complications Prenatal Complications - Diabetes: none.    Dilation: 5 Effacement (%): 70 Station: -2 Exam by:: Denyse Dago, RN Blood pressure 128/71, pulse 80, temperature (P) 98.8 F (37.1 C), temperature source (P) Oral, resp. rate (P) 18, height 5\' 4"  (1.626 m), weight 89.4 kg. Maternal Exam:  Uterine Assessment: Contraction strength is moderate.  Contraction frequency is irregular.   Abdomen: Patient reports no abdominal tenderness. Fetal presentation: vertex  Introitus: Normal vulva. Normal vagina.  Ferning test: positive.  Nitrazine test: positive. Amniotic fluid character: clear.  Pelvis: adequate for delivery.   Cervix: Cervix evaluated by digital exam.     Physical Exam  Constitutional: She is oriented to person, place, and time. She appears well-developed and well-nourished.  HENT:  Head: Normocephalic and atraumatic.  Neck: Normal range of motion. Neck supple.  Cardiovascular: Normal rate and regular rhythm.  Respiratory: Effort normal and breath sounds normal.  GI: Soft. Bowel sounds are normal.  Genitourinary:    Vulva, vagina and uterus normal.   Musculoskeletal:  Normal range of motion.  Neurological: She is alert and oriented to person, place, and time. She has normal reflexes.  Skin: Skin is warm and dry.  Psychiatric: She has a normal mood and affect.    Prenatal labs: ABO, Rh: --/--/PENDING (06/21 2110) Antibody: PENDING (06/21 2110) Rubella: Immune (11/05 0000) RPR: Nonreactive (11/05 0000)  HBsAg: Negative (11/05 0000)  HIV: Non-reactive (11/05 0000)  GBS:     Assessment/Plan: Term IUP SROM in early labor Declines Pitocin at this  time Augment prn   Scottie Stanish J 05/19/2019, 10:17 PM

## 2019-05-20 ENCOUNTER — Encounter (HOSPITAL_COMMUNITY)
Admission: AD | Disposition: A | Discharge status: Home / Self Care | Payer: Self-pay | Source: Home / Self Care | Attending: Obstetrics and Gynecology

## 2019-05-20 ENCOUNTER — Encounter (HOSPITAL_COMMUNITY): Payer: Self-pay | Admitting: *Deleted

## 2019-05-20 ENCOUNTER — Inpatient Hospital Stay (HOSPITAL_COMMUNITY): Admitting: Anesthesiology

## 2019-05-20 LAB — ABO/RH: ABO/RH(D): O POS

## 2019-05-20 LAB — CBC
HCT: 29.9 % — ABNORMAL LOW (ref 36.0–46.0)
Hemoglobin: 10.2 g/dL — ABNORMAL LOW (ref 12.0–15.0)
MCH: 31.3 pg (ref 26.0–34.0)
MCHC: 34.1 g/dL (ref 30.0–36.0)
MCV: 91.7 fL (ref 80.0–100.0)
Platelets: 122 10*3/uL — ABNORMAL LOW (ref 150–400)
RBC: 3.26 MIL/uL — ABNORMAL LOW (ref 3.87–5.11)
RDW: 14.1 % (ref 11.5–15.5)
WBC: 20.9 10*3/uL — ABNORMAL HIGH (ref 4.0–10.5)
nRBC: 0 % (ref 0.0–0.2)

## 2019-05-20 LAB — RPR: RPR Ser Ql: NONREACTIVE

## 2019-05-20 SURGERY — Surgical Case
Anesthesia: Epidural | Site: Abdomen | Wound class: Clean Contaminated

## 2019-05-20 MED ORDER — KETOROLAC TROMETHAMINE 30 MG/ML IJ SOLN: 30.0000 mg | Freq: Four times a day (QID) | INTRAMUSCULAR | Status: DC | PRN

## 2019-05-20 MED ORDER — SODIUM CHLORIDE 0.9% FLUSH: 3.0000 mL | INTRAVENOUS | Status: DC | PRN

## 2019-05-20 MED ORDER — KETOROLAC TROMETHAMINE 30 MG/ML IJ SOLN
30.0000 mg | Freq: Four times a day (QID) | INTRAMUSCULAR | Status: DC
  Administered 2019-05-20 – 2019-05-21 (×3): 30 mg via INTRAVENOUS
  Filled 2019-05-20 (×3): qty 1

## 2019-05-20 MED ORDER — FLUOXETINE HCL 20 MG PO CAPS
40.0000 mg | ORAL_CAPSULE | Freq: Every day | ORAL | Status: DC
  Administered 2019-05-20 – 2019-05-23 (×4): 40 mg via ORAL
  Filled 2019-05-20 (×5): qty 2

## 2019-05-20 MED ORDER — LIDOCAINE HCL (PF) 1 % IJ SOLN
INTRAMUSCULAR | Status: DC | PRN
  Administered 2019-05-20: 4 mL via EPIDURAL
  Administered 2019-05-20: 5 mL via EPIDURAL

## 2019-05-20 MED ORDER — SIMETHICONE 80 MG PO CHEW
80.0000 mg | CHEWABLE_TABLET | ORAL | Status: DC
  Administered 2019-05-20 – 2019-05-22 (×3): 80 mg via ORAL
  Filled 2019-05-20 (×3): qty 1

## 2019-05-20 MED ORDER — SODIUM BICARBONATE 8.4 % IV SOLN
INTRAVENOUS | Status: DC | PRN
  Administered 2019-05-20: 7 mL via EPIDURAL
  Administered 2019-05-20: 3 mL via EPIDURAL
  Administered 2019-05-20: 2 mL via EPIDURAL

## 2019-05-20 MED ORDER — ONDANSETRON HCL 4 MG/2ML IJ SOLN
INTRAMUSCULAR | Status: AC
  Filled 2019-05-20: qty 2

## 2019-05-20 MED ORDER — DIPHENHYDRAMINE HCL 25 MG PO CAPS: 25.0000 mg | ORAL_CAPSULE | Freq: Four times a day (QID) | ORAL | Status: DC | PRN

## 2019-05-20 MED ORDER — NALBUPHINE HCL 10 MG/ML IJ SOLN: 5.0000 mg | Freq: Once | INTRAMUSCULAR | Status: DC | PRN

## 2019-05-20 MED ORDER — CEFAZOLIN SODIUM-DEXTROSE 2-3 GM-%(50ML) IV SOLR
INTRAVENOUS | Status: DC | PRN
  Administered 2019-05-20: 2 g via INTRAVENOUS

## 2019-05-20 MED ORDER — HYDROMORPHONE HCL 1 MG/ML IJ SOLN: 0.2500 mg | INTRAMUSCULAR | Status: DC | PRN

## 2019-05-20 MED ORDER — METHYLERGONOVINE MALEATE 0.2 MG/ML IJ SOLN: 0.2000 mg | INTRAMUSCULAR | Status: DC | PRN

## 2019-05-20 MED ORDER — WITCH HAZEL-GLYCERIN EX PADS: 1.0000 "application " | MEDICATED_PAD | CUTANEOUS | Status: DC | PRN

## 2019-05-20 MED ORDER — KETOROLAC TROMETHAMINE 30 MG/ML IJ SOLN: 30.0000 mg | Freq: Once | INTRAMUSCULAR | Status: DC | PRN

## 2019-05-20 MED ORDER — NALBUPHINE HCL 10 MG/ML IJ SOLN: 5.0000 mg | INTRAMUSCULAR | Status: DC | PRN

## 2019-05-20 MED ORDER — BUPIVACAINE HCL (PF) 0.25 % IJ SOLN
INTRAMUSCULAR | Status: AC
  Filled 2019-05-20: qty 20

## 2019-05-20 MED ORDER — LACTATED RINGERS IV SOLN
INTRAVENOUS | Status: DC
  Administered 2019-05-20: 22:00:00 via INTRAVENOUS

## 2019-05-20 MED ORDER — IBUPROFEN 800 MG PO TABS
800.0000 mg | ORAL_TABLET | Freq: Four times a day (QID) | ORAL | Status: DC
  Administered 2019-05-21: 800 mg via ORAL
  Filled 2019-05-20: qty 1

## 2019-05-20 MED ORDER — NALOXONE HCL 0.4 MG/ML IJ SOLN: 0.4000 mg | INTRAMUSCULAR | Status: DC | PRN

## 2019-05-20 MED ORDER — SODIUM BICARBONATE 8.4 % IV SOLN
INTRAVENOUS | Status: AC
  Filled 2019-05-20: qty 50

## 2019-05-20 MED ORDER — SODIUM CHLORIDE 0.9 % IV SOLN
INTRAVENOUS | Status: DC | PRN
  Administered 2019-05-20: 40 [IU] via INTRAVENOUS

## 2019-05-20 MED ORDER — MENTHOL 3 MG MT LOZG: 1.0000 | LOZENGE | OROMUCOSAL | Status: DC | PRN

## 2019-05-20 MED ORDER — PROMETHAZINE HCL 25 MG/ML IJ SOLN: 6.2500 mg | INTRAMUSCULAR | Status: DC | PRN

## 2019-05-20 MED ORDER — TRANEXAMIC ACID-NACL 1000-0.7 MG/100ML-% IV SOLN
INTRAVENOUS | Status: DC | PRN
  Administered 2019-05-20: 1000 mg via INTRAVENOUS

## 2019-05-20 MED ORDER — TRANEXAMIC ACID-NACL 1000-0.7 MG/100ML-% IV SOLN
INTRAVENOUS | Status: AC
  Filled 2019-05-20: qty 100

## 2019-05-20 MED ORDER — ACETAMINOPHEN 10 MG/ML IV SOLN
INTRAVENOUS | Status: AC
  Filled 2019-05-20: qty 100

## 2019-05-20 MED ORDER — DIPHENHYDRAMINE HCL 25 MG PO CAPS: 25.0000 mg | ORAL_CAPSULE | ORAL | Status: DC | PRN

## 2019-05-20 MED ORDER — MORPHINE SULFATE (PF) 0.5 MG/ML IJ SOLN
INTRAMUSCULAR | Status: AC
  Filled 2019-05-20: qty 10

## 2019-05-20 MED ORDER — MEPERIDINE HCL 25 MG/ML IJ SOLN: 6.2500 mg | INTRAMUSCULAR | Status: DC | PRN

## 2019-05-20 MED ORDER — SCOPOLAMINE 1 MG/3DAYS TD PT72
MEDICATED_PATCH | TRANSDERMAL | Status: AC
  Filled 2019-05-20: qty 1

## 2019-05-20 MED ORDER — OXYTOCIN 40 UNITS IN NORMAL SALINE INFUSION - SIMPLE MED: 2.5000 [IU]/h | INTRAVENOUS | Status: DC

## 2019-05-20 MED ORDER — SENNOSIDES-DOCUSATE SODIUM 8.6-50 MG PO TABS
2.0000 | ORAL_TABLET | ORAL | Status: DC
  Administered 2019-05-22 (×2): 2 via ORAL
  Filled 2019-05-20 (×3): qty 2

## 2019-05-20 MED ORDER — TETANUS-DIPHTH-ACELL PERTUSSIS 5-2.5-18.5 LF-MCG/0.5 IM SUSP: 0.5000 mL | Freq: Once | INTRAMUSCULAR | Status: DC

## 2019-05-20 MED ORDER — STERILE WATER FOR IRRIGATION IR SOLN
Status: DC | PRN
  Administered 2019-05-20: 1

## 2019-05-20 MED ORDER — BUPIVACAINE HCL (PF) 0.25 % IJ SOLN
INTRAMUSCULAR | Status: DC | PRN
  Administered 2019-05-20: 20 mL

## 2019-05-20 MED ORDER — ONDANSETRON HCL 4 MG/2ML IJ SOLN: 4.0000 mg | Freq: Three times a day (TID) | INTRAMUSCULAR | Status: DC | PRN

## 2019-05-20 MED ORDER — SODIUM CHLORIDE 0.9 % IR SOLN
Status: DC | PRN
  Administered 2019-05-20: 1

## 2019-05-20 MED ORDER — FENTANYL-BUPIVACAINE-NACL 0.5-0.125-0.9 MG/250ML-% EP SOLN: 12.0000 mL/h | EPIDURAL | Status: DC | PRN

## 2019-05-20 MED ORDER — DIBUCAINE (PERIANAL) 1 % EX OINT: 1.0000 "application " | TOPICAL_OINTMENT | CUTANEOUS | Status: DC | PRN

## 2019-05-20 MED ORDER — ONDANSETRON HCL 4 MG/2ML IJ SOLN
INTRAMUSCULAR | Status: DC | PRN
  Administered 2019-05-20: 4 mg via INTRAVENOUS

## 2019-05-20 MED ORDER — METHYLERGONOVINE MALEATE 0.2 MG PO TABS: 0.2000 mg | ORAL_TABLET | ORAL | Status: DC | PRN

## 2019-05-20 MED ORDER — PRENATAL MULTIVITAMIN CH
1.0000 | ORAL_TABLET | Freq: Every day | ORAL | Status: DC
  Administered 2019-05-21 – 2019-05-23 (×3): 1 via ORAL
  Filled 2019-05-20 (×3): qty 1

## 2019-05-20 MED ORDER — ACETAMINOPHEN 500 MG PO TABS
1000.0000 mg | ORAL_TABLET | Freq: Four times a day (QID) | ORAL | Status: AC
  Administered 2019-05-20 – 2019-05-21 (×4): 1000 mg via ORAL
  Filled 2019-05-20 (×3): qty 2

## 2019-05-20 MED ORDER — ZOLPIDEM TARTRATE 5 MG PO TABS: 5.0000 mg | ORAL_TABLET | Freq: Every evening | ORAL | Status: DC | PRN

## 2019-05-20 MED ORDER — SODIUM CHLORIDE 0.9 % IV SOLN
INTRAVENOUS | Status: DC | PRN
  Administered 2019-05-20: 11:00:00 via INTRAVENOUS

## 2019-05-20 MED ORDER — PHENYLEPHRINE HCL (PRESSORS) 10 MG/ML IV SOLN
INTRAVENOUS | Status: DC | PRN
  Administered 2019-05-20: 40 ug via INTRAVENOUS

## 2019-05-20 MED ORDER — OXYCODONE-ACETAMINOPHEN 5-325 MG PO TABS: 1.0000 | ORAL_TABLET | ORAL | Status: DC | PRN

## 2019-05-20 MED ORDER — COCONUT OIL OIL
1.0000 "application " | TOPICAL_OIL | Status: DC | PRN
  Administered 2019-05-21: 1 via TOPICAL

## 2019-05-20 MED ORDER — MORPHINE SULFATE (PF) 0.5 MG/ML IJ SOLN
INTRAMUSCULAR | Status: DC | PRN
  Administered 2019-05-20: 3 mg via EPIDURAL

## 2019-05-20 MED ORDER — NALOXONE HCL 4 MG/10ML IJ SOLN
1.0000 ug/kg/h | INTRAVENOUS | Status: DC | PRN
  Filled 2019-05-20: qty 5

## 2019-05-20 MED ORDER — SODIUM CHLORIDE (PF) 0.9 % IJ SOLN
INTRAMUSCULAR | Status: DC | PRN
  Administered 2019-05-20: 12 mL/h via EPIDURAL

## 2019-05-20 MED ORDER — DIPHENHYDRAMINE HCL 50 MG/ML IJ SOLN: 12.5000 mg | INTRAMUSCULAR | Status: DC | PRN

## 2019-05-20 MED ORDER — OXYTOCIN 40 UNITS IN NORMAL SALINE INFUSION - SIMPLE MED
INTRAVENOUS | Status: AC
  Filled 2019-05-20: qty 1000

## 2019-05-20 MED ORDER — SIMETHICONE 80 MG PO CHEW
80.0000 mg | CHEWABLE_TABLET | Freq: Three times a day (TID) | ORAL | Status: DC
  Administered 2019-05-20 – 2019-05-23 (×9): 80 mg via ORAL
  Filled 2019-05-20 (×9): qty 1

## 2019-05-20 MED ORDER — SIMETHICONE 80 MG PO CHEW: 80.0000 mg | CHEWABLE_TABLET | ORAL | Status: DC | PRN

## 2019-05-20 MED ORDER — SCOPOLAMINE 1 MG/3DAYS TD PT72
1.0000 | MEDICATED_PATCH | Freq: Once | TRANSDERMAL | Status: AC
  Administered 2019-05-20: 1.5 mg via TRANSDERMAL

## 2019-05-20 SURGICAL SUPPLY — 39 items
APL SKNCLS STERI-STRIP NONHPOA (GAUZE/BANDAGES/DRESSINGS) ×1
BENZOIN TINCTURE PRP APPL 2/3 (GAUZE/BANDAGES/DRESSINGS) ×2 IMPLANT
CHLORAPREP W/TINT 26ML (MISCELLANEOUS) ×3 IMPLANT
CLAMP CORD UMBIL (MISCELLANEOUS) IMPLANT
CLOSURE WOUND 1/2 X4 (GAUZE/BANDAGES/DRESSINGS) ×1
CLOTH BEACON ORANGE TIMEOUT ST (SAFETY) ×3 IMPLANT
DRSG OPSITE POSTOP 4X10 (GAUZE/BANDAGES/DRESSINGS) ×3 IMPLANT
ELECT REM PT RETURN 9FT ADLT (ELECTROSURGICAL) ×3
ELECTRODE REM PT RTRN 9FT ADLT (ELECTROSURGICAL) ×1 IMPLANT
EXTRACTOR VACUUM M CUP 4 TUBE (SUCTIONS) IMPLANT
EXTRACTOR VACUUM M CUP 4' TUBE (SUCTIONS)
GLOVE BIO SURGEON STRL SZ7.5 (GLOVE) ×3 IMPLANT
GLOVE BIOGEL PI IND STRL 7.0 (GLOVE) ×1 IMPLANT
GLOVE BIOGEL PI INDICATOR 7.0 (GLOVE) ×2
GOWN STRL REUS W/TWL LRG LVL3 (GOWN DISPOSABLE) ×6 IMPLANT
KIT ABG SYR 3ML LUER SLIP (SYRINGE) IMPLANT
NDL HYPO 25X5/8 SAFETYGLIDE (NEEDLE) IMPLANT
NDL SPNL 20GX3.5 QUINCKE YW (NEEDLE) IMPLANT
NEEDLE HYPO 22GX1.5 SAFETY (NEEDLE) ×3 IMPLANT
NEEDLE HYPO 25X5/8 SAFETYGLIDE (NEEDLE) IMPLANT
NEEDLE SPNL 20GX3.5 QUINCKE YW (NEEDLE) IMPLANT
NS IRRIG 1000ML POUR BTL (IV SOLUTION) ×3 IMPLANT
PACK C SECTION WH (CUSTOM PROCEDURE TRAY) ×3 IMPLANT
PENCIL SMOKE EVAC W/HOLSTER (ELECTROSURGICAL) ×3 IMPLANT
STRIP CLOSURE SKIN 1/2X4 (GAUZE/BANDAGES/DRESSINGS) ×1 IMPLANT
SUT MNCRL 0 VIOLET CTX 36 (SUTURE) ×2 IMPLANT
SUT MNCRL AB 3-0 PS2 27 (SUTURE) ×2 IMPLANT
SUT MON AB 2-0 CT1 27 (SUTURE) ×3 IMPLANT
SUT MON AB-0 CT1 36 (SUTURE) ×6 IMPLANT
SUT MONOCRYL 0 CTX 36 (SUTURE) ×6
SUT PLAIN 0 NONE (SUTURE) IMPLANT
SUT PLAIN 2 0 (SUTURE) ×3
SUT PLAIN 2 0 XLH (SUTURE) IMPLANT
SUT PLAIN ABS 2-0 CT1 27XMFL (SUTURE) IMPLANT
SYR 20CC LL (SYRINGE) IMPLANT
SYR CONTROL 10ML LL (SYRINGE) ×3 IMPLANT
TOWEL OR 17X24 6PK STRL BLUE (TOWEL DISPOSABLE) ×3 IMPLANT
TRAY FOLEY W/BAG SLVR 14FR LF (SET/KITS/TRAYS/PACK) ×3 IMPLANT
WATER STERILE IRR 1000ML POUR (IV SOLUTION) ×3 IMPLANT

## 2019-05-20 NOTE — Progress Notes (Signed)
Dana Serrano is a 28 y.o. G1P0 at [redacted]w[redacted]d by LMP admitted for active labor, rupture of membranes  Subjective: Pushing x 90 min Feels pressure  Objective: BP 121/75   Pulse (!) 118   Temp 99 F (37.2 C) (Oral)   Resp 20   Ht 5\' 4"  (1.626 m)   Wt 89.4 kg   SpO2 99%   BMI 33.81 kg/m  I/O last 3 completed shifts: In: 600 [Other:600] Out: -  Total I/O In: -  Out: 150 [Urine:150]  FHT:  FHR: 145 bpm, variability: moderate,  accelerations:  Present,  decelerations:  Absent UC:   regular, every 2 minutes SVE:   Dilation: 10 Effacement (%): 100 Station: Plus 1, Plus 2 Exam by:: Dr. Ronita Hipps  ROT  Labs: Lab Results  Component Value Date   WBC 12.8 (H) 05/19/2019   HGB 12.3 05/19/2019   HCT 35.9 (L) 05/19/2019   MCV 91.8 05/19/2019   PLT 134 (L) 05/19/2019    Assessment / Plan: Poor descent in second stage- likely transverse arrest  Sono 5/5 c/w EFW 34LP percentile- implications discussed with and husband  Labor: Minimal descent after 90 min pushing Preeclampsia:  no signs or symptoms of toxicity Fetal Wellbeing:  Category I Pain Control:  Epidural I/D:  n/a Anticipated MOD:  guarded  Discussed likely csection with pt and husband. Questions answered and consent done.  Will attempt to push for additional 70min per pt request.  Dana Serrano J 05/20/2019, 9:10 AM

## 2019-05-20 NOTE — Progress Notes (Signed)
Dana Serrano is a 28 y.o. G1P0 at [redacted]w[redacted]d by LMP admitted for active labor, rupture of membranes  Subjective: Pushing x 90 min Feels pressure  Objective: BP 131/72   Pulse 78   Temp 98.6 F (37 C) (Oral)   Resp 20   Ht 5\' 4"  (1.626 m)   Wt 89.4 kg   SpO2 99%   BMI 33.81 kg/m  I/O last 3 completed shifts: In: 600 [Other:600] Out: -  Total I/O In: -  Out: 150 [Urine:150]  FHT:  FHR: 145 bpm, variability: moderate,  accelerations:  Present,  decelerations:  Absent UC:   regular, every 2 minutes SVE:   Dilation: 10 Effacement (%): 100 Station: Plus 1, Plus 2 Exam by:: Dr. Ronita Hipps  ROT  Labs: Lab Results  Component Value Date   WBC 12.8 (H) 05/19/2019   HGB 12.3 05/19/2019   HCT 35.9 (L) 05/19/2019   MCV 91.8 05/19/2019   PLT 134 (L) 05/19/2019    Assessment / Plan: Poor descent in second stage- likely transverse arrest  Sono 5/5 c/w EFW 13YQ percentile- implications discussed with and husband  Labor: Minimal descent after 90 min pushing Preeclampsia:  no signs or symptoms of toxicity Fetal Wellbeing:  Category I Pain Control:  Epidural I/D:  n/a Anticipated MOD:  guarded  Dana Serrano 05/20/2019, 8:08 AM

## 2019-05-20 NOTE — Progress Notes (Signed)
[name] was referred for history of depression/anxiety. * Referral screened out by Clinical Social Worker because none of the following criteria appear to apply: ~ History of anxiety/depression during this pregnancy, or of post-partum depression following prior delivery. ~ Diagnosis of anxiety and/or depression within last 3 years. Per [name]'s record, [name] diagnosed with anxiety in 2016.  OR * [name]'s symptoms currently being treated with medication and/or therapy. Per [name]'s PNC Record, [name] on Prozac for anxiety.     Please contact the Clinical Social Worker if needs arise, by [name] request, or if [name] scores greater than 9/yes to question 10 on Edinburgh Postpartum Depression Screen.       Sam Overbeck S. Kendahl Bumgardner, MSW, LCSW-A [facility] [phone]  

## 2019-05-20 NOTE — Op Note (Addendum)
Cesarean Section Procedure Note  Indications: failure to progress: arrest of descent  Pre-operative Diagnosis: 39 week 5 day pregnancy.  Post-operative Diagnosis: same  Surgeon: Lovenia Kim   Assistants: Janann Colonel, CNM  Anesthesia: Epidural anesthesia and Local anesthesia 0.25.% bupivacaine  ASA Class: 2  Procedure Details  The patient was seen in the Holding Room. The risks, benefits, complications, treatment options, and expected outcomes were discussed with the patient.  The patient concurred with the proposed plan, giving informed consent. The risks of anesthesia, infection, bleeding and possible injury to other organs discussed. Injury to bowel, bladder, or ureter with possible need for repair discussed. Possible need for transfusion with secondary risks of hepatitis or HIV acquisition discussed. Post operative complications to include but not limited to DVT, PE and Pneumonia noted. The site of surgery properly noted/marked. The patient was taken to Operating Room # C, identified as Dana Serrano and the procedure verified as C-Section Delivery. A Time Out was held and the above information confirmed.  After induction of anesthesia, the patient was draped and prepped in the usual sterile manner. A Pfannenstiel incision was made and carried down through the subcutaneous tissue to the fascia. Fascial incision was made and extended transversely using Mayo scissors. The fascia was separated from the underlying rectus tissue superiorly and inferiorly. The peritoneum was identified and entered. Peritoneal incision was extended longitudinally. The utero-vesical peritoneal reflection was incised transversely and the bladder flap was bluntly freed from the lower uterine segment. A low transverse uterine incision(Kerr hysterotomy) was made. Delivered from OT presentation was a  female with Apgar scores of 8 at one minute and 9 at five minutes. Bulb suctioning gently performed. Neonatal team  in attendance.After the umbilical cord was clamped and cut cord blood was obtained for evaluation. The placenta was removed intact and appeared normal. The uterus was curetted with a dry lap pack. Good hemostasis was noted.The uterine outline, tubes and ovaries appeared normal. The uterine incision was closed with running locked sutures of 0 Monocryl x 2 layers. Additional 0 monocryl at left margin place due to extension into uterine aa on left. Hemostasis was observed. The parietal peritoneum was closed with a running 2-0 Monocryl suture. The fascia was then reapproximated with running sutures of 0 Monocryl. The skin was reapproximated with 3-0 monocryl after Bainbridge closure with 2-0 plain.  Instrument, sponge, and needle counts were correct prior the abdominal closure and at the conclusion of the case.   Findings: FTLM, OT, likely LGA  Estimated Blood Loss:  1000 per automated device,(clinical estimate 650)         Drains: foley                 Specimens: placenta                 Complications:  None; patient tolerated the procedure well.         Disposition: PACU - hemodynamically stable.         Condition: stable  Attending Attestation: I performed the procedure.

## 2019-05-20 NOTE — Progress Notes (Signed)
Dana Serrano is a 28 y.o. G1P0 at [redacted]w[redacted]d by LMP admitted for active labor, rupture of membranes  Subjective: Starting to push now Objective: BP 122/81   Pulse 86   Temp 98.5 F (36.9 C) (Oral)   Resp 18   Ht 5\' 4"  (1.626 m)   Wt 89.4 kg   SpO2 99%   BMI 33.81 kg/m  No intake/output data recorded. No intake/output data recorded.  FHT:  FHR: 145 bpm, variability: moderate,  accelerations:  Present,  decelerations:  Absent UC:   regular, every 2-3 minutes SVE:   Dilation: 10 Effacement (%): 100 Station: Plus 2 Exam by:: Cecelia Byars, RN  Labs: Lab Results  Component Value Date   WBC 12.8 (H) 05/19/2019   HGB 12.3 05/19/2019   HCT 35.9 (L) 05/19/2019   MCV 91.8 05/19/2019   PLT 134 (L) 05/19/2019    Assessment / Plan: Augmentation of labor, progressing well  Labor: Progressing normally Preeclampsia:  no signs or symptoms of toxicity Fetal Wellbeing:  Category I Pain Control:  Epidural I/D:  n/a Anticipated MOD:  NSVD  Dana Serrano J 05/20/2019, 6:27 AM

## 2019-05-20 NOTE — Anesthesia Procedure Notes (Signed)
Epidural Patient location during procedure: OB Start time: 05/20/2019 1:55 AM End time: 05/20/2019 1:58 AM  Staffing Anesthesiologist: Audry Pili, MD Performed: anesthesiologist   Preanesthetic Checklist Completed: patient identified, pre-op evaluation, timeout performed, IV checked, risks and benefits discussed and monitors and equipment checked  Epidural Patient position: sitting Prep: DuraPrep Patient monitoring: continuous pulse ox and blood pressure Approach: midline Location: L2-L3 Injection technique: LOR saline  Needle:  Needle type: Tuohy  Needle gauge: 17 G Needle length: 9 cm Needle insertion depth: 5 cm Catheter size: 19 Gauge Catheter at skin depth: 10 cm Test dose: negative and Other (1% lidocaine)  Assessment Events: blood not aspirated  Additional Notes Patient identified. Risks including, but not limited to, bleeding, infection, nerve damage, paralysis, inadequate analgesia, blood pressure changes, nausea, vomiting, allergic reaction, postpartum back pain, itching, and headache were discussed. Patient expressed understanding and wished to proceed. Sterile prep and drape, including hand hygiene, mask, and sterile gloves were used. The patient was positioned and the spine was prepped. The skin was anesthetized with lidocaine. No paraesthesia or other complication noted. The patient did not experience any signs of intravascular injection such as tinnitus or metallic taste in mouth, nor signs of intrathecal spread such as rapid motor block. Please see nursing notes for vital signs. The patient tolerated the procedure well.   Renold Don, MDReason for block:procedure for pain

## 2019-05-20 NOTE — Anesthesia Preprocedure Evaluation (Addendum)
Anesthesia Evaluation  Patient identified by MRN, date of birth, ID band Patient awake    Reviewed: Allergy & Precautions, NPO status , Patient's Chart, lab work & pertinent test results  History of Anesthesia Complications Negative for: history of anesthetic complications  Airway Mallampati: II   Neck ROM: Full    Dental   Pulmonary neg pulmonary ROS,    Pulmonary exam normal        Cardiovascular negative cardio ROS Normal cardiovascular exam     Neuro/Psych PSYCHIATRIC DISORDERS Anxiety negative neurological ROS     GI/Hepatic Neg liver ROS,  IBS    Endo/Other   Obesity   Renal/GU negative Renal ROS     Musculoskeletal negative musculoskeletal ROS (+)   Abdominal   Peds  Hematology  PLT - 134k    Anesthesia Other Findings HSV  Reproductive/Obstetrics (+) Pregnancy                             Anesthesia Physical Anesthesia Plan  ASA: II  Anesthesia Plan: Epidural   Post-op Pain Management:    Induction:   PONV Risk Score and Plan: 2 and Treatment may vary due to age or medical condition  Airway Management Planned: Natural Airway  Additional Equipment: None  Intra-op Plan:   Post-operative Plan:   Informed Consent: I have reviewed the patients History and Physical, chart, labs and discussed the procedure including the risks, benefits and alternatives for the proposed anesthesia with the patient or authorized representative who has indicated his/her understanding and acceptance.       Plan Discussed with: Anesthesiologist  Anesthesia Plan Comments: (For C/S with labor epidural)       Anesthesia Quick Evaluation

## 2019-05-20 NOTE — Lactation Note (Signed)
This note was copied from a baby's chart. Lactation Consultation Note  Patient Name: Dana Serrano IOEVO'J Date: 05/20/2019 Reason for consult: Initial assessment;1st time breastfeeding;Primapara;Term;Breast augmentation  10 hours old FT female who is being exclusively BF by his mother, she's a P1. Mom reported (+) breast changes during the pregnancy, she also told LC she had a breast augmentation in 2013 but the surgeon left the nipple/areola complex intact. When doing hand expression, mom was leaking colostrum very easily out of both breast, showed her the C hold, praised her for her efforts. She has a [employer]ra DEBP at home.  Baby finishing nursing when entering the room, but he fell asleep shortly. Mom denied any assistance with the latch at this point, she stated that BF is going well and that baby has been able to latch without any problem; she has LATCH scores of 9. Asked mom to call for assistance when needed. Reviewed cluster feeding, feeding cues and normal newborn behavior.  Feeding plan:  1. Encouraged mom to feed baby STS 8-12 times/24 hours or sooner if feeding cues are present 2. Hand expression and spoon feeding were also encouraged  BF brochure, BF resources and feeding diary were reviewed. Dad present and supportive. Parents reported all questions and concerns were answered, they're both aware of [place] services and will call PRN.  Maternal Data Formula Feeding for Exclusion: Yes Reason for exclusion: Previous breast surgery (mastectomy, reduction, or augmentation where mother is unable to produce breast milk) Has patient been taught Hand Expression?: Yes Does the patient have breastfeeding experience prior to this delivery?: No  Feeding Feeding Type: Breast Fed   Interventions Interventions: Breast feeding basics reviewed;Hand express;Breast compression;Breast massage  Lactation Tools Discussed/Used WIC Program: No   Consult Status Consult Status:  Follow-up Date: 05/21/19 Follow-up type: In-patient    Favio Moder Francene Boyers 05/20/2019, 9:42 PM

## 2019-05-20 NOTE — Anesthesia Postprocedure Evaluation (Signed)
Anesthesia Post Note  Patient: Leilani Able  Procedure(s) Performed: CESAREAN SECTION (N/A Abdomen)     Patient location during evaluation: PACU Anesthesia Type: Epidural Level of consciousness: awake Pain management: pain level controlled Vital Signs Assessment: post-procedure vital signs reviewed and stable Respiratory status: spontaneous breathing Cardiovascular status: stable Postop Assessment: no headache, no backache, epidural receding and no apparent nausea or vomiting Anesthetic complications: no    Last Vitals:  Vitals:   05/20/19 1230 05/20/19 1233  BP:  113/76  Pulse: 89 86  Resp: 15 18  Temp:    SpO2: 99% 99%    Last Pain:  Vitals:   05/20/19 1233  TempSrc:   PainSc: 0-No pain   Pain Goal:    LLE Motor Response: Responds to commands (05/20/19 1233)   RLE Motor Response: Responds to commands (05/20/19 1233)       Epidural/Spinal Function Cutaneous sensation: Able to Wiggle Toes (05/20/19 1233), Patient able to flex knees: Yes (05/20/19 1233), Patient able to lift hips off bed: No (05/20/19 1233), Back pain beyond tenderness at insertion site: No (05/20/19 1233), Progressively worsening motor and/or sensory loss: No (05/20/19 1233), Bowel and/or bladder incontinence post epidural: No (05/20/19 1233)  Huston Foley

## 2019-05-20 NOTE — Transfer of Care (Signed)
Immediate Anesthesia Transfer of Care Note  Patient: Dana Serrano  Procedure(s) Performed: CESAREAN SECTION (N/A Abdomen)  Patient Location: PACU  Anesthesia Type:Regional  Level of Consciousness: awake, alert  and oriented  Airway & Oxygen Therapy: Patient Spontanous Breathing  Post-op Assessment: Report given to RN and Post -op Vital signs reviewed and stable  Post vital signs: Reviewed and stable  Last Vitals:  Vitals Value Taken Time  BP 107/54 05/20/19 1131  Temp    Pulse 89 05/20/19 1137  Resp 18 05/20/19 1137  SpO2 100 % 05/20/19 1137  Vitals shown include unvalidated device data.  Last Pain:  Vitals:   05/20/19 1001  TempSrc: Oral  PainSc: 0-No pain         Complications: No apparent anesthesia complications

## 2019-05-21 LAB — CBC
HCT: 25.4 % — ABNORMAL LOW (ref 36.0–46.0)
Hemoglobin: 8.6 g/dL — ABNORMAL LOW (ref 12.0–15.0)
MCH: 31.7 pg (ref 26.0–34.0)
MCHC: 33.9 g/dL (ref 30.0–36.0)
MCV: 93.7 fL (ref 80.0–100.0)
Platelets: 107 10*3/uL — ABNORMAL LOW (ref 150–400)
RBC: 2.71 MIL/uL — ABNORMAL LOW (ref 3.87–5.11)
RDW: 14.5 % (ref 11.5–15.5)
WBC: 14.9 10*3/uL — ABNORMAL HIGH (ref 4.0–10.5)
nRBC: 0 % (ref 0.0–0.2)

## 2019-05-21 MED ORDER — FERROUS SULFATE 325 (65 FE) MG PO TABS
325.0000 mg | ORAL_TABLET | Freq: Two times a day (BID) | ORAL | Status: DC
  Administered 2019-05-23: 325 mg via ORAL
  Filled 2019-05-21 (×3): qty 1

## 2019-05-21 MED ORDER — OXYCODONE HCL 5 MG PO TABS
5.0000 mg | ORAL_TABLET | ORAL | Status: DC | PRN
  Administered 2019-05-21: 5 mg via ORAL
  Administered 2019-05-22 (×4): 10 mg via ORAL
  Filled 2019-05-21 (×2): qty 2
  Filled 2019-05-21: qty 1
  Filled 2019-05-21 (×2): qty 2

## 2019-05-21 MED ORDER — ACETAMINOPHEN 500 MG PO TABS
1000.0000 mg | ORAL_TABLET | Freq: Four times a day (QID) | ORAL | Status: AC
  Administered 2019-05-21 – 2019-05-22 (×4): 1000 mg via ORAL
  Filled 2019-05-21 (×4): qty 2

## 2019-05-21 MED ORDER — MAGNESIUM OXIDE 400 (241.3 MG) MG PO TABS
400.0000 mg | ORAL_TABLET | Freq: Every day | ORAL | Status: DC
  Administered 2019-05-22 – 2019-05-23 (×2): 400 mg via ORAL
  Filled 2019-05-21 (×2): qty 1

## 2019-05-21 NOTE — Progress Notes (Addendum)
POSTOPERATIVE DAY # 1 S/P Primary LTCS for arrest of descent, baby boy "Dana Serrano"  S:         Reports feeling okay, minimal discomfort at rest, but more sore with movement             Tolerating po intake / no nausea / no vomiting / no flatus / no BM  Denies dizziness, SOB, or CP             Bleeding is light             Pain controlled with Motrin             Up ad lib / ambulatory/ voiding QS  Newborn breast feeding - going well; mild nipple sorness / Circumcision - completed today    O:  VS: BP (!) 99/49 (BP Location: Left Arm)   Pulse 79   Temp 98.6 F (37 C) (Oral)   Resp 16   Ht 5\' 4"  (1.626 m)   Wt 89.4 kg   SpO2 98%   Breastfeeding Unknown   BMI 33.81 kg/m  05/21/19 1355  98.6 F (37 C)  79  -  16  99/49Abnormal   High-fowlers  -  -  - MH   05/21/19 0959  98.4 F (36.9 C)  75  -  18  95/48Abnormal   Supine  98 %  -  - JS   05/21/19 0400  98.2 F (36.8 C)  72  -  18  96/55Abnormal   Lying  100 %  -  - JM   05/21/19 0000  97.8 F (36.6 C)  71  -  20  98/51Abnormal   Semi-fowlers  98 %  -  - JM   05/20/19 1952  98 F (36.7 C)  82  -  20  103/54Abnormal   Semi-fowlers  98 %  -  - JM   05/20/19 1530  98.9 F (37.2 C)  85  -  18  106/73  Supine  97 %  -  - JS   05/20/19 1430  99.2 F (37.3 C)                       LABS:               Recent Labs    05/20/19 1231 05/21/19 0441  WBC 20.9* 14.9*  HGB 10.2* 8.6*  PLT 122* 107*               Bloodtype: --/--/O POS, O POS (06/21 2110)  Rubella: Immune (11/05 0000)                                             I&O: Intake/Output      06/22 0701 - 06/23 0700 06/23 0701 - 06/24 0700   P.O. 720    I.V. (mL/kg) 2900 (32.4)    Other     Total Intake(mL/kg) 3620 (40.5)    Urine (mL/kg/hr) 2800 (1.3) 900 (1.1)   Blood 1161    Total Output 3961 900   Net -341 -900                     Physical Exam:             Alert and Oriented X3  Lungs: Clear and unlabored  Heart:  regular rate and rhythm / no murmurs  Abdomen:  soft, non-tender, mild gaseous distention, hypoactive bowel sounds in all quadrants              Fundus: firm, non-tender, U-1             Dressing: honeycomb - old brown drainage marked, otherwise dry and intact              Incision:  approximated with sutures / no erythema / no ecchymosis / no drainage  Perineum: intact; significant labial edema from pushing   Lochia: scant on pad   Extremities: +2 pitting LE edema, no calf pain or tenderness  A:        POD # 1 S/P Primary LTCS for arrest of descent            Hx. Of Anxiety - stable on Prozac 40mg  daily   ABL Anemia compounding chronic IDA - stable, low BP, no tachycardia but asymptomatic   Gestational thrombocytopenia on admission - plts stable   P:        Routine postoperative care              Begin Ferrous Sulfate 325mg  PO BID tomorrow when bowel function improves  Magnesium oxide 400mg  PO daily   Reorder Tylenol 1000mg  every 6 hrs PRN for pain   Oxycodone IR 5-10mg  every 4hrs PRN for severe pain   May shower today  Encouraged to rest when baby rests  Lactation support  May consider early d/c home tomorrow   Carlean JewsMeredith Sigmon, MSN, CNM [facility]

## 2019-05-21 NOTE — Lactation Note (Addendum)
This note was copied from a baby's chart. Lactation Consultation Note  Patient Name: Dana Serrano [id number] Date: 05/21/2019 Reason for consult: Follow-up assessment   Baby 44 hours old and latched upon entering.  6.9% weight loss.  Mother had breast implants in 2013.   3 voids/4 stools in 20 hours.  Mother can easily hand express colostrum. Discussed with mother and RN that if after BS check we may need to get mother pumping w/ DEBP.   Referred mother to [email or web address].org which encouraged post pumping for mother's with implants after 24 hours in addition to breastfeeding. Discussed hand expressing and spoon feeding with each feeding.       Maternal Data    Feeding Feeding Type: Breast Fed  LATCH Score Latch: Grasps breast easily, tongue down, lips flanged, rhythmical sucking.  Audible Swallowing: A few with stimulation  Type of Nipple: Everted at rest and after stimulation  Comfort (Breast/Nipple): Soft / non-tender  Hold (Positioning): No assistance needed to correctly position infant at breast.  LATCH Score: 9  Interventions Interventions: Expressed milk;Coconut oil  Lactation Tools Discussed/Used     Consult Status Consult Status: Follow-up Date: 05/22/19 Follow-up type: In-patient    Vivianne Master [facility] 05/21/2019, 10:47 AM

## 2019-05-22 ENCOUNTER — Inpatient Hospital Stay (HOSPITAL_COMMUNITY)

## 2019-05-22 MED ORDER — ACETAMINOPHEN 500 MG PO TABS: 1000.0000 mg | ORAL_TABLET | Freq: Four times a day (QID) | ORAL | Status: DC | PRN

## 2019-05-22 MED ORDER — IBUPROFEN 800 MG PO TABS
800.0000 mg | ORAL_TABLET | Freq: Four times a day (QID) | ORAL | Status: DC
  Administered 2019-05-22 – 2019-05-23 (×4): 800 mg via ORAL
  Filled 2019-05-22 (×4): qty 1

## 2019-05-22 MED ORDER — SODIUM CHLORIDE 0.9 % IV SOLN
510.0000 mg | Freq: Once | INTRAVENOUS | Status: AC
  Administered 2019-05-22: 510 mg via INTRAVENOUS
  Filled 2019-05-22: qty 17

## 2019-05-22 NOTE — Lactation Note (Signed)
This note was copied from a baby's chart. Lactation Consultation Note  Patient Name: Dana Serrano Today's Date: 05/22/2019   P1, 57 hour female infant, 7% weight los and mom had breast augmentation. LC did not observe a latch mom breastfeed prior to [facility] entering the room. Mom had breast augmentation and was given information about breastfeeding after augmentation website by [facility]. Per parents, infant been sleepy and sleep for longer period of time after circumcision. He is now starting to cluster feed. Mom feels breastfeeding is going well.  LC did not observe latch due infant breastfeeding  Infant was a little jittery so he will have labs drawn tonight.  LC suggested STS,  Breastfeed according to  hunger cues and mom can hand express after breastfeeding or pump to given infant more volume of EBM.  Mom is concern about cost of using DEBP in hospital and she has DEBP at home. LC gave mom a  harmony hand pump explain their is not cost in using it. Mom will continue to breastfeed infant according hunger cues, 8 to 12 times within 24 hours and on demand.  IMom will call Nurse or [place] if she needs assistance with latching infant to breast.  Maternal Data    Feeding    LATCH Score                   Interventions    Lactation Tools Discussed/Used     Consult Status      [name] 05/22/2019, 1:01 AM

## 2019-05-22 NOTE — Lactation Note (Addendum)
This note was copied from a baby's chart. Lactation Consultation Note  Patient Name: Dana Serrano INOMV'E Date: 05/22/2019   Baby 71 hours old.  Mother has abrasions on tips of nipples.  9% weight loss. Mother had implants in 2013. She has already been referred to [email or web address].org Provided mother w/ comfort gels. For soreness suggest mother apply ebm or coconut oil  and alternate with comfort gels.  Mother does not have bra with her so gave her mesh tube top. Suggest mother call for LC to observe feeding.   Returned to room at 48 hours to observe feeding. Baby latches easily with lips flanged. Assisted mother w/ maintaining depth during feeding to prevent further nipple damage. Mother hand expressed a few drops of colostrum. Yesterday mother was encouraged to spoon feed drops with each feeding.  Mother states due to cluster feeding she did not give him volume back.  LC last night suggested mother post pump w/ DEBP and parents stated they did not want to pay for pump kit. Encouraged mother to hand express before and /post pump for 10 min per side after each feeding and give volume back to baby. Stressed the importance to parents of extra stimulation to breasts.        Maternal Data    Feeding    LATCH Score                   Interventions    Lactation Tools Discussed/Used     Consult Status      Vivianne Master [facility] 05/22/2019, 10:25 AM

## 2019-05-22 NOTE — Progress Notes (Signed)
Was notified of high pt temperature of 100.6. Spoke with patient who stated she was upset and had been crying just before her temperature was taken. Gave patient her scheduled Tylenol and reassessed temperature axillary (pt had been drinking cold water). Temperature was 99.5.

## 2019-05-22 NOTE — Progress Notes (Signed)
POD# 2 s/p PCS  S: Pt notes pain worsening, incisional, not taking oral pain meds and this encouraged. minimal lochia, nl void, out of bed w/ mild dizziness and fatigue, pt has not been up much. tol reg po, + flatus. Pt is  breastfeeding  Vitals:   05/21/19 1355 05/21/19 2307 05/22/19 0007 05/22/19 0622  BP: (!) 99/49 (!) 100/56  (!) 113/58  Pulse: 79 87  71  Resp: 16 16  16   Temp: 98.6 F (37 C) (!) 100.6 F (38.1 C) 99.5 F (37.5 C) 98.4 F (36.9 C)  TempSrc: Oral Oral Axillary Oral  SpO2:    100%  Weight:      Height:        Gen: well appearing CV: RRR Pulm: CTAB Abd: soft, ND, approp tender, fundus below umbilicus, NT Inc: C/D/I,  LE: tr edema, NT  CBC    Component Value Date/Time   WBC 14.9 (H) 05/21/2019 0441   RBC 2.71 (L) 05/21/2019 0441   HGB 8.6 (L) 05/21/2019 0441   HCT 25.4 (L) 05/21/2019 0441   PLT 107 (L) 05/21/2019 0441   MCV 93.7 05/21/2019 0441   MCH 31.7 05/21/2019 0441   MCHC 33.9 05/21/2019 0441   RDW 14.5 05/21/2019 0441    A/P: POD#  2 s/p PCS - post-op. Doing well. Encourage pain med use and more walking - anemia, symptomatic, plan IV iron now - d/c tomorrow as needs continued maternal and breastfeeding support  Ala Dach 05/22/2019 10:20 AM

## 2019-05-23 LAB — CBC WITH DIFFERENTIAL/PLATELET
Abs Immature Granulocytes: 0.11 10*3/uL — ABNORMAL HIGH (ref 0.00–0.07)
Basophils Absolute: 0 10*3/uL (ref 0.0–0.1)
Basophils Relative: 0 %
Eosinophils Absolute: 0.3 10*3/uL (ref 0.0–0.5)
Eosinophils Relative: 2 %
HCT: 23.2 % — ABNORMAL LOW (ref 36.0–46.0)
Hemoglobin: 7.7 g/dL — ABNORMAL LOW (ref 12.0–15.0)
Immature Granulocytes: 1 %
Lymphocytes Relative: 17 %
Lymphs Abs: 1.9 10*3/uL (ref 0.7–4.0)
MCH: 31.2 pg (ref 26.0–34.0)
MCHC: 33.2 g/dL (ref 30.0–36.0)
MCV: 93.9 fL (ref 80.0–100.0)
Monocytes Absolute: 0.6 10*3/uL (ref 0.1–1.0)
Monocytes Relative: 5 %
Neutro Abs: 8.5 10*3/uL — ABNORMAL HIGH (ref 1.7–7.7)
Neutrophils Relative %: 75 %
Platelets: 129 10*3/uL — ABNORMAL LOW (ref 150–400)
RBC: 2.47 MIL/uL — ABNORMAL LOW (ref 3.87–5.11)
RDW: 14.7 % (ref 11.5–15.5)
WBC: 11.4 10*3/uL — ABNORMAL HIGH (ref 4.0–10.5)
nRBC: 0 % (ref 0.0–0.2)

## 2019-05-23 MED ORDER — IBUPROFEN 800 MG PO TABS: 800.0000 mg | ORAL_TABLET | Freq: Four times a day (QID) | ORAL | 0 refills | Status: DC

## 2019-05-23 MED ORDER — FLUOXETINE HCL 40 MG PO CAPS: 40.0000 mg | ORAL_CAPSULE | Freq: Every day | ORAL | 3 refills | Status: DC

## 2019-05-23 MED ORDER — SENNOSIDES-DOCUSATE SODIUM 8.6-50 MG PO TABS: 2.0000 | ORAL_TABLET | ORAL | 0 refills | Status: DC

## 2019-05-23 MED ORDER — ACETAMINOPHEN 500 MG PO TABS: 1000.0000 mg | ORAL_TABLET | Freq: Four times a day (QID) | ORAL | 0 refills | Status: DC | PRN

## 2019-05-23 MED ORDER — FERROUS SULFATE 325 (65 FE) MG PO TABS: 325.0000 mg | ORAL_TABLET | Freq: Two times a day (BID) | ORAL | 3 refills | Status: DC

## 2019-05-23 MED ORDER — OXYCODONE HCL 5 MG PO TABS: 5.0000 mg | ORAL_TABLET | ORAL | 0 refills | Status: DC | PRN

## 2019-05-23 NOTE — Lactation Note (Signed)
This note was copied from a baby's chart. Lactation Consultation Note Baby 28 hrs old.  Mom tearful wanting formula. Feels baby is starving and mom can't BF anymore d/t pain. Mom stated she has BF for 1 hr and baby still wants to feed.  Mom's nipples cracked, positional stripes bilaterally.   Mom using coconut oil, after BF applying comfort gels to clean nipples. Mom knows not to apply CG to nipples w/coconut oil on them. Noted baby chomping, bobbing head up and down w/feeding. Noted labial frenulum to end of gum line. Baby can raise tongue some and extend to lips. W/gloved finger noted chomping w/gums, then extended tongue. Baby has narrow palate.  Mom has everted nipples. Transitional milk noted w/hand expression. Strongly encouraged mom to pump or hand express. Mom stated her nipples were hurting to bad right now that she needed to rest them. Discussed engorgement and breast filling encouraging to give BM verses formula. Mom stated she just needed to give the baby some formula d/t he is hungry and she can't tolerate BF right now d/t pain.  Fitted mom w/#24 NS. Baby latched well. Taught chin tug and lip flange if needed.  Mom stated NS felt much better. Mom wanted baby to have formula. Inserted formula into NS w/curve tip syring. Baby drank quickly. 5 fr tubing inserted into baby's corner of mouth while feeding. Baby fed well. Paced feeding. Baby BF 25 min. Took total of 11 ml Similac formula. RN was spoon feeding when LC came into rm. Gave total of 5 ml formula. Total of 16 ml of formula given. Baby sleeping soundly. Mom  Applying CG and will rest. FOB holding baby while mom rest. Encouraged mom to massage breast at intervals while feeding.  Mom refuses to use DEBP. Encouraged hand pump. Mom states nipple to sore at this time. Encouraged to hand express.    Patient Name: Dana Serrano [id number] Date: 05/23/2019 Reason for consult: Mother's request;Nipple pain/trauma;Term;1st  time breastfeeding   Maternal Data Has patient been taught Hand Expression?: Yes Does the patient have breastfeeding experience prior to this delivery?: No  Feeding Feeding Type: Formula  LATCH Score Latch: Grasps breast easily, tongue down, lips flanged, rhythmical sucking.  Audible Swallowing: A few with stimulation  Type of Nipple: Everted at rest and after stimulation  Comfort (Breast/Nipple): Engorged, cracked, bleeding, large blisters, severe discomfort  Hold (Positioning): Assistance needed to correctly position infant at breast and maintain latch.  LATCH Score: 6  Interventions Interventions: Adjust position;Breast feeding basics reviewed;Assisted with latch;Support pillows;Skin to skin;Position options;Breast massage;Hand express;Comfort gels;Breast compression;Hand pump  Lactation Tools Discussed/Used Tools: Pump;Comfort gels;Coconut oil;Nipple Shields;65F feeding tube / Syringe Nipple shield size: 24;20(used #24 NS) Pump Review: Milk Storage   Consult Status Consult Status: Follow-up Date: 05/23/19 Follow-up type: In-patient    Theodoro Kalata 05/23/2019, 3:17 AM

## 2019-05-23 NOTE — Lactation Note (Addendum)
This note was copied from a baby's chart. Lactation Consultation Note  Patient Name: Dana Serrano Today's Date: 05/23/2019   For now, Mom will pump & bottle feed until her nipples have healed. Mom has compression stripes bilaterally (the L nipple seems more severe). CNM has prescribed APNO.  A DEBP was initiated. Size 27 flanges gave her the most comfort at this time. Mom feels that her breast are filling; 1-2 mLs of milk was expressed with pumping. Mom encouraged to pump whenever infant receives formula.   Mom says that before her breast augmentation, her breasts were just smaller (an "A" cup, but denies wide spacing, etc).   Mom was tearful on occasion during consult. Mom is on Prozac 40 mg.  [name] [facility] 05/23/2019, 11:12 AM

## 2019-05-23 NOTE — Discharge Summary (Addendum)
Obstetric Discharge Summary   Patient Name: Dana Serrano DOB: 20-Aug-1991 MRN: [id number]  Date of Admission: 05/19/2019 Date of Discharge: 05/23/2019 Date of Delivery: 05/20/2019 Gestational Age at Delivery: [redacted]w[redacted]d  Primary OB: [facility] - Dr. Pamala Hurry   Antepartum complications:  - Anxiety on Prozac  - Maternal IDA - Low-lying placenta resolved Prenatal Labs:  ABO, Rh: --/--/O pos(06/21 2110) Antibody: negative (06/21 2110) Rubella: Immune (11/05 0000) RPR: Nonreactive (11/05 0000)  HBsAg: Negative (11/05 0000)  HIV: Non-reactive (11/05 0000)  GBS:   Negative Admitting Diagnosis: 39+5 weeks SROM  Secondary Diagnoses: Patient Active Problem List   Diagnosis Date Noted  . Postpartum care following cesarean delivery (6/22) 05/20/2019  . Normal labor 05/19/2019  . Generalized anxiety disorder 03/03/2017  . Immunity status testing 05/06/2015  . Lactose intolerance 05/02/2013    Augmentation: Pitocin  Complications: Prolonged 2nd stage, arrest of descent   Date of Delivery: 05/20/2019 Delivered By: Dr. Ronita Hipps  Delivery Type: primary cesarean section, low transverse incision Anesthesia: epidural  Newborn Data: Live born female  Birth Weight: 9 lb 6.1 oz (4255 g) APGAR: 8, 9  Newborn Delivery   Birth date/time: 05/20/2019 10:42:00 Delivery type: C-Section, Low Transverse Trial of labor: Yes C-section categorization: Primary         Hospital/Postpartum Course  (Cesarean Section):  Pt. Admitted with SROM and augmented with Pitocin. She progressed to 10cm and pushed for over 3 hours without descent.  Decision was made for cesarean section. See notes and delivery summary/op notes for details. Patient had asymptomatic acute blood loss anemia on 6/24 and received IV iron x 1 dose. By time of discharge on POD#3, her pain was controlled on oral pain medications; she had appropriate lochia and was ambulating, voiding without difficulty, tolerating regular diet and  passing flatus.   She was deemed stable for discharge to home.     Labs: CBC Latest Ref Rng & Units 05/23/2019 05/21/2019 05/20/2019  WBC 4.0 - 10.5 K/uL 11.4(H) 14.9(H) 20.9(H)  Hemoglobin 12.0 - 15.0 g/dL 7.7(L) 8.6(L) 10.2(L)  Hematocrit 36.0 - 46.0 % 23.2(L) 25.4(L) 29.9(L)  Platelets 150 - 400 K/uL 129(L) 107(L) 122(L)   O POS  Physical exam:  BP 111/67 (BP Location: Right Arm)   Pulse 68   Temp 98.3 F (36.8 C) (Oral)   Resp 18   Ht 5\' 4"  (1.626 m)   Wt 89.4 kg   SpO2 100%   Breastfeeding Unknown   BMI 33.81 kg/m  General: alert and no distress Pulm: normal respiratory effort Lochia: appropriate Abdomen: soft, NT Uterine Fundus: firm, below umbilicus *Perineum: healing well, no significant erythema, +edema Incision: c/d/i, healing well, no significant drainage, no dehiscence, no significant erythema Extremities: No evidence of DVT seen on physical exam. +1 LE lower extremity edema.   Disposition: stable, discharge to home Baby Feeding: breast milk and formula Baby Disposition: home with mom  Contraception: undecided  Rh Immune globulin given: N/A Rubella vaccine given: N/A Tdap vaccine given in AP or PP setting: UTD Flu vaccine given in AP or PP setting: UTD   Plan:  Dana Serrano was discharged to home in good condition. Follow-up appointment at [facility] in 6 weeks.  Discharge Instructions: Per After Visit Summary. Refer to After Visit Summary and [facility] discharge booklet  Activity: Advance as tolerated. Pelvic rest for 6 weeks.   Diet: Regular, Heart Healthy Discharge Medications: Allergies as of 05/23/2019   No Known Allergies     Medication List  STOP taking these medications   ferrous sulfate 75 (15 Fe) MG/ML Soln Commonly known as: FER-IN-SOL Replaced by: ferrous sulfate 325 (65 FE) MG tablet   FLUoxetine 20 MG tablet Commonly known as: PROZAC Replaced by: FLUoxetine 40 MG capsule     TAKE these medications    acetaminophen 500 MG tablet Commonly known as: TYLENOL Take 2 tablets (1,000 mg total) by mouth every 6 (six) hours as needed for moderate pain.   ferrous sulfate 325 (65 FE) MG tablet Take 1 tablet (325 mg total) by mouth 2 (two) times daily with a meal. Replaces: ferrous sulfate 75 (15 Fe) MG/ML Soln   FLUoxetine 40 MG capsule Commonly known as: PROZAC Take 1 capsule (40 mg total) by mouth daily. Start taking on: May 24, 2019 Replaces: FLUoxetine 20 MG tablet   ibuprofen 800 MG tablet Commonly known as: ADVIL Take 1 tablet (800 mg total) by mouth every 6 (six) hours.   oxyCODONE 5 MG immediate release tablet Commonly known as: Oxy IR/ROXICODONE Take 1-2 tablets (5-10 mg total) by mouth every 4 (four) hours as needed for severe pain.   Prenatal 19 tablet Chew 1 tablet by mouth daily.   senna-docusate 8.6-50 MG tablet Commonly known as: Senokot-S Take 2 tablets by mouth daily. Start taking on: May 24, 2019      Outpatient follow up:  Follow-up Information    Noland FordyceFogleman, Kelly, MD. Go in 6 week(s).   Specialty: Obstetrics and Gynecology Why: Postpartum visit Contact information: [address] [place] [address] [phone]           Signed:  Carlean JewsMeredith Sigmon, MSN, CNM [facility]

## 2019-05-23 NOTE — Progress Notes (Signed)
POSTOPERATIVE DAY # 3 S/P Primary LTCS for arrest of descent, baby boy "Nolen"   S:         Reports feeling better today; ready to go home             Tolerating po intake / no nausea / no vomiting / + flatus / no BM  Denies dizziness, SOB, or CP             Bleeding is light             Pain controlled with Motrin and Oxycodone IR - reports incisional pain but manageable with medications; encouraged Tylenol use              Up ad lib / ambulatory/ voiding QS  Newborn breast feeding with formula - significant nipple pain and scabbing  / Circumcision - completed   O:  VS: BP 111/67 (BP Location: Right Arm)   Pulse 68   Temp 98.3 F (36.8 C) (Oral)   Resp 18   Ht 5\' 4"  (1.626 m)   Wt 89.4 kg   SpO2 100%   Breastfeeding Unknown   BMI 33.81 kg/m    LABS:               Recent Labs    05/21/19 0441 05/23/19 0508  WBC 14.9* 11.4*  HGB 8.6* 7.7*  PLT 107* 129*               Bloodtype: --/--/O POS, O POS (06/21 2110)  Rubella: Immune (11/05 0000)                                             I&O: Intake/Output      06/24 0701 - 06/25 0700 06/25 0701 - 06/26 0700   I.V. (mL/kg)     Other     Total Intake(mL/kg)     Urine (mL/kg/hr)     Total Output     Net                       Physical Exam:             Alert and Oriented X3  Lungs: Clear and unlabored  Breast: nipples scabbed and ecchymosis noted bilaterally  Heart: regular rate and rhythm / no murmurs  Abdomen: soft, non-tender, non-distended, active bowel sounds in all quadrants              Fundus: firm, non-tender, U-3             Dressing: honeycomb with steri-strips c/d/i              Incision:  approximated with sutures / no erythema / no ecchymosis / no drainage  Perineum: intact; labial edema improving  Lochia: scant on pad  Extremities: +1 LE edema, no calf pain or tenderness,   A:        POD # 3 S/P Primary LTCS for arrest of descent            Hx. Of Anxiety - stable on Prozac 40mg  daily   ABL Anemia compounding chronic IDA - stable, s/p IV Feraheme, now on oral FE             Gestational thrombocytopenia on admission - plts stable   P:        Routine postoperative  care              Discharge home today  [facility] discharge book given, instructions and warning s/s reviewed  Will send in [facility]   F/u in 6 weeks   Lars Pinks, MSN, CNM [facility]

## 2019-05-24 ENCOUNTER — Ambulatory Visit: Payer: Self-pay

## 2019-05-24 NOTE — Lactation Note (Signed)
This note was copied from a baby's chart. Lactation Consultation Note  Patient Name: Dana Serrano [id number] Date: 05/24/2019 Reason for consult: Follow-up assessment;Infant weight loss;Nipple pain/trauma;Term;1st time breastfeeding;[place] Baby is 41 days old  As LC entered the room mom holding baby sitting on the couch and dad packing up , ready for D/C .  Per mom  due to soreness ( nipples ) has been pumping consistently around the clock and her milk is coming in.  Dad showed the [place] the container of milk. And it was appriox 25 -30 ml;.  LC praised mom for her pumping. Per mom already has her comfort gels and coconut oil.  LC recommended and instructed mom on the use shells alternating with the comfort gels.  Sore nipple and engorgement prevention and tx reviewed.  [place] reminded mom not to mix the coconut oil and comfort gels and that she only had 6 days on the gels and discard.  Per mom has Spectra 2 at home DEBP and is aware of the 2 different size flanges in the kit.  LC recommended and offered to request and LC O/P appt and mom declined and will call for about a week.  LC recommended if the soreness doesn't clear up by 4 days to call for [facility].  LC reassured mom the soreness will improve with the sore nipple tx and to protect her establishing milk supply by consistent pumping around the clock.  As the soreness improves to work on latching to the breast.   Mom aware of the [facility] resources after D/C [place] and has the pamphlet with the phone number.     Maternal Data Does the patient have breastfeeding experience prior to this delivery?: No  Feeding Feeding Type: Bottle Fed - Formula Nipple Type: Slow - flow  LATCH Score                   Interventions Interventions: Breast feeding basics reviewed  Lactation Tools Discussed/Used WIC Program: No Pump Review: Milk Storage   Consult Status Consult Status: Complete Date: 05/24/19    Jerlyn Ly  Thea Holshouser 05/24/2019, 11:45 AM

## 2019-07-18 ENCOUNTER — Encounter (HOSPITAL_COMMUNITY): Payer: Self-pay

## 2019-09-20 ENCOUNTER — Other Ambulatory Visit: Payer: Self-pay

## 2019-09-20 ENCOUNTER — Encounter: Payer: Self-pay | Admitting: Physician Assistant

## 2019-09-20 ENCOUNTER — Ambulatory Visit (INDEPENDENT_AMBULATORY_CARE_PROVIDER_SITE_OTHER): Admitting: Physician Assistant

## 2019-09-20 VITALS — BP 104/62 | HR 78 | Temp 98.0°F | Resp 16 | Ht 64.0 in | Wt 170.0 lb

## 2019-09-20 DIAGNOSIS — R5382 Chronic fatigue, unspecified: Secondary | ICD-10-CM | POA: Diagnosis not present

## 2019-09-20 DIAGNOSIS — F411 Generalized anxiety disorder: Secondary | ICD-10-CM | POA: Diagnosis not present

## 2019-09-20 DIAGNOSIS — D509 Iron deficiency anemia, unspecified: Secondary | ICD-10-CM

## 2019-09-20 DIAGNOSIS — Z0000 Encounter for general adult medical examination without abnormal findings: Secondary | ICD-10-CM | POA: Diagnosis not present

## 2019-09-20 LAB — COMPREHENSIVE METABOLIC PANEL
ALT: 22 U/L (ref 0–35)
AST: 21 U/L (ref 0–37)
Albumin: 4.6 g/dL (ref 3.5–5.2)
Alkaline Phosphatase: 89 U/L (ref 39–117)
BUN: 9 mg/dL (ref 6–23)
CO2: 29 mEq/L (ref 19–32)
Calcium: 9.4 mg/dL (ref 8.4–10.5)
Chloride: 103 mEq/L (ref 96–112)
Creatinine, Ser: 0.7 mg/dL (ref 0.40–1.20)
GFR: 99.15 mL/min (ref >60.00)
Glucose, Bld: 93 mg/dL (ref 70–99)
Potassium: 3.9 mEq/L (ref 3.5–5.1)
Sodium: 140 mEq/L (ref 135–145)
Total Bilirubin: 0.5 mg/dL (ref 0.2–1.2)
Total Protein: 6.8 g/dL (ref 6.0–8.3)

## 2019-09-20 LAB — CBC WITH DIFFERENTIAL/PLATELET
Basophils Absolute: 0 10*3/uL (ref 0.0–0.1)
Basophils Relative: 0.5 % (ref 0.0–3.0)
Eosinophils Absolute: 0.4 10*3/uL (ref 0.0–0.7)
Eosinophils Relative: 4.5 % (ref 0.0–5.0)
HCT: 42.1 % (ref 36.0–46.0)
Hemoglobin: 14.4 g/dL (ref 12.0–15.0)
Lymphocytes Relative: 20.6 % (ref 12.0–46.0)
Lymphs Abs: 1.7 10*3/uL (ref 0.7–4.0)
MCHC: 34.2 g/dL (ref 30.0–36.0)
MCV: 87.1 fl (ref 78.0–100.0)
Monocytes Absolute: 0.5 10*3/uL (ref 0.1–1.0)
Monocytes Relative: 5.7 % (ref 3.0–12.0)
Neutro Abs: 5.6 10*3/uL (ref 1.4–7.7)
Neutrophils Relative %: 68.7 % (ref 43.0–77.0)
Platelets: 183 10*3/uL (ref 150.0–400.0)
RBC: 4.84 Mil/uL (ref 3.87–5.11)
RDW: 14.2 % (ref 11.5–15.5)
WBC: 8.1 10*3/uL (ref 4.0–10.5)

## 2019-09-20 LAB — LIPID PANEL
Cholesterol: 194 mg/dL (ref 0–200)
HDL: 60.4 mg/dL (ref >39.00)
LDL Cholesterol: 113 mg/dL — ABNORMAL HIGH (ref 0–99)
NonHDL: 133.33
Total CHOL/HDL Ratio: 3
Triglycerides: 102 mg/dL (ref 0.0–149.0)
VLDL: 20.4 mg/dL (ref 0.0–40.0)

## 2019-09-20 LAB — B12 AND FOLATE PANEL
Folate: >24.1 ng/mL (ref >5.9)
Vitamin B-12: 475 pg/mL (ref 211–911)

## 2019-09-20 LAB — MAGNESIUM: Magnesium: 2 mg/dL (ref 1.5–2.5)

## 2019-09-20 LAB — HEMOGLOBIN A1C: Hgb A1c MFr Bld: 5.5 % (ref 4.6–6.5)

## 2019-09-20 LAB — SEDIMENTATION RATE: Sed Rate: 9 mm/hr (ref 0–20)

## 2019-09-20 LAB — TSH: TSH: 0.4 u[IU]/mL (ref 0.35–4.50)

## 2019-09-20 LAB — VITAMIN D 25 HYDROXY (VIT D DEFICIENCY, FRACTURES): VITD: 30.39 ng/mL (ref 30.00–100.00)

## 2019-09-20 NOTE — Progress Notes (Signed)
Patient presents to clinic today for annual exam.  Patient is fasting for labs.  Acute Concerns: Fatigue since birth of child. Baby sleeping well so not multiple awakenings for her.  IDA during third trimester -- still on iron supplementation. Has not had CBC rechecked in a while. Notes when she sleeps she feels like it is good sleep but still feels very tired in AM. Husband denies snoring or noted apneic episodes. Denies change to bowel or bladder habits. Is keeping well-hydrated.  No dizziness, lightheadedness. Is taking her Fluoxetine as directed and feels mood is stable. Denies SI/HI.  Health Maintenance: Immunizations -- UTD PAP -- UTD  Past Medical History:  Diagnosis Date  . Anemia   . Anxiety   . History of chicken pox   . HSV (herpes simplex virus) anogenital infection   . IBS (irritable bowel syndrome)     Past Surgical History:  Procedure Laterality Date  . BREAST ENHANCEMENT SURGERY     2013  . CESAREAN SECTION N/A 05/20/2019   Procedure: CESAREAN SECTION;  Surgeon: Brien Few, MD;  Location: [facility];  Service: Obstetrics;  Laterality: N/A;  . TONSILLECTOMY     2009  . WISDOM TOOTH EXTRACTION     08.2015    Current Outpatient Medications on File Prior to Visit  Medication Sig Dispense Refill  . ferrous sulfate 325 (65 FE) MG tablet Take 1 tablet (325 mg total) by mouth 2 (two) times daily with a meal. 60 tablet 3  . FLUoxetine (PROZAC) 40 MG capsule Take 1 capsule (40 mg total) by mouth daily. 60 capsule 3  . Prenatal Vit-Fe Fumarate-FA (PRENATAL 19) tablet Chew 1 tablet by mouth daily.     No current facility-administered medications on file prior to visit.     No Known Allergies  Family History  Problem Relation Age of Onset  . Hypertension Father        Living  . Obesity Father   . Alcoholism Father   . Alcohol abuse Father   . Hyperlipidemia Father   . Heart disease Paternal Grandfather   . Diabetes Paternal Grandfather   .  Hyperlipidemia Paternal Grandfather   . Heart attack Paternal Grandfather   . Hypertension Paternal Grandfather   . COPD Paternal Grandfather   . Breast cancer Paternal Grandmother   . Depression Paternal Grandmother   . Alcoholism Mother   . Alcohol abuse Mother   . Skin cancer Maternal Grandmother   . Cancer Maternal Grandmother   . Renal Disease Maternal Grandfather   . Bipolar disorder Maternal Aunt   . Healthy Brother        x1  . Drug abuse Paternal Uncle   . Early death Paternal Uncle     Social History   Socioeconomic History  . Marital status: Married    Spouse name: Abe People  . Number of children: Not on file  . Years of education: Not on file  . Highest education level: Not on file  Occupational History  . Occupation: NP  Social Needs  . Financial resource strain: Not hard at all  . Food insecurity    Worry: Never true    Inability: Never true  . Transportation needs    Medical: No    Non-medical: No  Tobacco Use  . Smoking status: Never Smoker  . Smokeless tobacco: Never Used  Substance and Sexual Activity  . Alcohol use: Yes    Comment: socially, not while preg  . Drug use: No  .  Sexual activity: Yes    Birth control/protection: None  Lifestyle  . Physical activity    Days per week: Patient refused    Minutes per session: Patient refused  . Stress: Rather much  Relationships  . Social Musician on phone: Patient refused    Gets together: Patient refused    Attends religious service: Patient refused    Active member of club or organization: Patient refused    Attends meetings of clubs or organizations: Patient refused    Relationship status: Patient refused  . Intimate partner violence    Fear of current or ex partner: No    Emotionally abused: No    Physically abused: No    Forced sexual activity: No  Other Topics Concern  . Not on file  Social History Narrative  . Not on file   Review of Systems  Constitutional: Positive for  malaise/fatigue. Negative for fever and weight loss.  HENT: Negative for ear discharge, ear pain, hearing loss and tinnitus.   Eyes: Negative for blurred vision, double vision, photophobia and pain.  Respiratory: Negative for cough and shortness of breath.   Cardiovascular: Negative for chest pain and palpitations.  Gastrointestinal: Negative for abdominal pain, blood in stool, constipation, diarrhea, heartburn, melena, nausea and vomiting.  Genitourinary: Negative for dysuria, flank pain, frequency, hematuria and urgency.  Musculoskeletal: Negative for falls.  Neurological: Negative for dizziness, loss of consciousness and headaches.  Endo/Heme/Allergies: Negative for environmental allergies.  Psychiatric/Behavioral: Negative for depression, hallucinations, substance abuse and suicidal ideas. The patient is not nervous/anxious and does not have insomnia.    BP 104/62   Pulse 78   Temp 98 F (36.7 C) (Temporal)   Resp 16   Ht 5\' 4"  (1.626 m)   Wt 170 lb (77.1 kg)   SpO2 98%   Breastfeeding Yes   BMI 29.18 kg/m   Physical Exam Vitals signs reviewed.  Constitutional:      Appearance: Normal appearance.  HENT:     Head: Normocephalic and atraumatic.     Right Ear: Tympanic membrane, ear canal and external ear normal.     Left Ear: Tympanic membrane, ear canal and external ear normal.     Nose: Nose normal. No mucosal edema.     Mouth/Throat:     Pharynx: Uvula midline. No oropharyngeal exudate or posterior oropharyngeal erythema.  Eyes:     Conjunctiva/sclera: Conjunctivae normal.     Pupils: Pupils are equal, round, and reactive to light.  Neck:     Musculoskeletal: Neck supple.     Thyroid: No thyromegaly.  Cardiovascular:     Rate and Rhythm: Normal rate and regular rhythm.     Heart sounds: Normal heart sounds.  Pulmonary:     Effort: Pulmonary effort is normal. No respiratory distress.     Breath sounds: Normal breath sounds. No wheezing or rales.  Abdominal:      General: Bowel sounds are normal. There is no distension.     Palpations: Abdomen is soft. There is no mass.     Tenderness: There is no abdominal tenderness. There is no guarding or rebound.  Lymphadenopathy:     Cervical: No cervical adenopathy.  Skin:    General: Skin is warm and dry.     Findings: No rash.  Neurological:     Mental Status: She is alert and oriented to person, place, and time.     Cranial Nerves: No cranial nerve deficit.    Assessment/Plan: 1. Visit  for preventive health examination Depression screen negative. Health Maintenance reviewed. Preventive schedule discussed and handout given in AVS. Will obtain fasting labs today.  2. Generalized anxiety disorder Stable. Continue current regimen.   3. Iron deficiency anemia, unspecified iron deficiency anemia type On iron. Will repeat CBC.  4. Chronic fatigue Unclear etiology. No noted apneic episodes but having quiet issue with ongoing fatigue and non-restorative sleep. Will check labs to include the following:  - Sedimentation rate - Magnesium - CBC with Differential/Platelet - Comprehensive metabolic panel - Lipid panel - Hemoglobin A1c - TSH - Vitamin D (25 hydroxy) - B12 and Folate Panel    Piedad ClimesWilliam Cody Jenavive Lamboy, PA-C

## 2019-09-20 NOTE — Patient Instructions (Signed)
Please go to the lab for blood work.   Our office will call you with your results unless you have chosen to receive results via MyChart.  If your blood work is normal we will follow-up each year for physicals and as scheduled for chronic medical problems.  If anything is abnormal we will treat accordingly and get you in for a follow-up.  We will work on setting you up for a sleep study.    Preventive Care 28-28 Years Old, Female Preventive care refers to visits with your health care provider and lifestyle choices that can promote health and wellness. This includes:  A yearly physical exam. This may also be called an annual well check.  Regular dental visits and eye exams.  Immunizations.  Screening for certain conditions.  Healthy lifestyle choices, such as eating a healthy diet, getting regular exercise, not using drugs or products that contain nicotine and tobacco, and limiting alcohol use. What can I expect for my preventive care visit? Physical exam Your health care provider will check your:  Height and weight. This may be used to calculate body mass index (BMI), which tells if you are at a healthy weight.  Heart rate and blood pressure.  Skin for abnormal spots. Counseling Your health care provider may ask you questions about your:  Alcohol, tobacco, and drug use.  Emotional well-being.  Home and relationship well-being.  Sexual activity.  Eating habits.  Work and work Statistician.  Method of birth control.  Menstrual cycle.  Pregnancy history. What immunizations do I need?  Influenza (flu) vaccine  This is recommended every year. Tetanus, diphtheria, and pertussis (Tdap) vaccine  You may need a Td booster every 10 years. Varicella (chickenpox) vaccine  You may need this if you have not been vaccinated. Human papillomavirus (HPV) vaccine  If recommended by your health care provider, you may need three doses over 6 months. Measles, mumps, and  rubella (MMR) vaccine  You may need at least one dose of MMR. You may also need a second dose. Meningococcal conjugate (MenACWY) vaccine  One dose is recommended if you are age 56-21 years and a first-year college student living in a residence hall, or if you have one of several medical conditions. You may also need additional booster doses. Pneumococcal conjugate (PCV13) vaccine  You may need this if you have certain conditions and were not previously vaccinated. Pneumococcal polysaccharide (PPSV23) vaccine  You may need one or two doses if you smoke cigarettes or if you have certain conditions. Hepatitis A vaccine  You may need this if you have certain conditions or if you travel or work in places where you may be exposed to hepatitis A. Hepatitis B vaccine  You may need this if you have certain conditions or if you travel or work in places where you may be exposed to hepatitis B. Haemophilus influenzae type b (Hib) vaccine  You may need this if you have certain conditions. You may receive vaccines as individual doses or as more than one vaccine together in one shot (combination vaccines). Talk with your health care provider about the risks and benefits of combination vaccines. What tests do I need?  Blood tests  Lipid and cholesterol levels. These may be checked every 5 years starting at age 86.  Hepatitis C test.  Hepatitis B test. Screening  Diabetes screening. This is done by checking your blood sugar (glucose) after you have not eaten for a while (fasting).  Sexually transmitted disease (STD) testing.  BRCA-related  cancer screening. This may be done if you have a family history of breast, ovarian, tubal, or peritoneal cancers.  Pelvic exam and Pap test. This may be done every 3 years starting at age 85. Starting at age 75, this may be done every 5 years if you have a Pap test in combination with an HPV test. Talk with your health care provider about your test results,  treatment options, and if necessary, the need for more tests. Follow these instructions at home: Eating and drinking   Eat a diet that includes fresh fruits and vegetables, whole grains, lean protein, and low-fat dairy.  Take vitamin and mineral supplements as recommended by your health care provider.  Do not drink alcohol if: ? Your health care provider tells you not to drink. ? You are pregnant, may be pregnant, or are planning to become pregnant.  If you drink alcohol: ? Limit how much you have to 0-1 drink a day. ? Be aware of how much alcohol is in your drink. In the [place] one drink equals one 12 oz bottle of beer (355 mL), one 5 oz glass of wine (148 mL), or one 1 oz glass of hard liquor (44 mL). Lifestyle  Take daily care of your teeth and gums.  Stay active. Exercise for at least 30 minutes on 5 or more days each week.  Do not use any products that contain nicotine or tobacco, such as cigarettes, e-cigarettes, and chewing tobacco. If you need help quitting, ask your health care provider.  If you are sexually active, practice safe sex. Use a condom or other form of birth control (contraception) in order to prevent pregnancy and STIs (sexually transmitted infections). If you plan to become pregnant, see your health care provider for a preconception visit. What's next?  Visit your health care provider once a year for a well check visit.  Ask your health care provider how often you should have your eyes and teeth checked.  Stay up to date on all vaccines. This information is not intended to replace advice given to you by your health care provider. Make sure you discuss any questions you have with your health care provider. Document Released: 01/10/2002 Document Revised: 07/26/2018 Document Reviewed: 07/26/2018 Elsevier Patient Education  2020 [employer]. .

## 2019-09-23 ENCOUNTER — Encounter: Payer: Self-pay | Admitting: Physician Assistant

## 2019-09-23 ENCOUNTER — Other Ambulatory Visit

## 2019-09-23 DIAGNOSIS — R5383 Other fatigue: Secondary | ICD-10-CM

## 2019-09-24 ENCOUNTER — Other Ambulatory Visit: Payer: Self-pay | Admitting: Emergency Medicine

## 2019-09-24 DIAGNOSIS — G4710 Hypersomnia, unspecified: Secondary | ICD-10-CM

## 2019-09-24 DIAGNOSIS — R5382 Chronic fatigue, unspecified: Secondary | ICD-10-CM

## 2019-09-24 DIAGNOSIS — R400 Somnolence: Secondary | ICD-10-CM

## 2019-09-24 LAB — T3, FREE: T3, Free: 3 pg/mL (ref 2.3–4.2)

## 2019-09-24 LAB — T4, FREE: Free T4: 1.3 ng/dL (ref 0.8–1.8)

## 2019-10-04 ENCOUNTER — Other Ambulatory Visit: Payer: Self-pay

## 2019-10-04 ENCOUNTER — Institutional Professional Consult (permissible substitution): Admitting: Pulmonary Disease

## 2019-10-04 ENCOUNTER — Ambulatory Visit (INDEPENDENT_AMBULATORY_CARE_PROVIDER_SITE_OTHER): Admitting: Pulmonary Disease

## 2019-10-04 ENCOUNTER — Encounter: Payer: Self-pay | Admitting: Pulmonary Disease

## 2019-10-04 DIAGNOSIS — G4710 Hypersomnia, unspecified: Secondary | ICD-10-CM | POA: Diagnosis not present

## 2019-10-04 NOTE — Patient Instructions (Signed)
Differential diagnosis includes idiopathic hypersomnolence "long sleeper" versus narcolepsy , less likely  Proceed with overnight NPSG followed by MSLT Keep sleep diary for 2 weeks  Try to increase sleep time by 30 minutes-9 hours per night if possible Light exposure for at least 30 minutes daily, especially in the winter  Avoid sedating medications  We discussed stimulants-but will hold off during lactation

## 2019-10-04 NOTE — Assessment & Plan Note (Signed)
Differential diagnosis includes idiopathic hypersomnolence "long sleeper" versus narcolepsy , less likely  Proceed with overnight NPSG followed by MSLT Keep sleep diary for 2 weeks  Try to increase sleep time by 30 minutes-9 hours per night if possible Light exposure for at least 30 minutes daily, especially in the winter  Avoid sedating medications  We discussed stimulants-but will hold off during lactation 

## 2019-10-04 NOTE — Progress Notes (Signed)
Subjective:    Patient ID: Dana Serrano, female    DOB: December 09, 1990, 28 y.o.   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 She has a 55-month-old son and is lactating.  Baby is sleeping better at night and she has settled into her rhythm.  She has noted low energy even with 8 to 9 hours of sleep.  She has a 30-minute ride to work and she drops her infant to her mom's place not as close to work and on the way she feels very sleepy while driving. She has always required 10 hours of sleep rather than 8.  On weekends when she is Serrano to sleep 10 to 12 hours she feels better rested and is Serrano to stay awake all day.  Her husband works as a Dealer and uses a CPAP  Epworth sleepiness score is 13 and she reports sleepiness in some social situations such as watching TV, sitting and reading, lying down to rest in the afternoons. Bedtime is between 9 and 10 PM, sleep latency is less than 30 minutes, she starts off sleeping prone on her stomach and rolls over on her back, with one pillow, reports occasional nocturnal awakenings due to her baby waking up and is out of bed around 6:30 AM feeling tired with occasional headaches and dryness of mouth. She has she had most of her pregnancy weight except for 10 to 15 pounds.  She drinks 1 cup of coffee in the mornings.  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 Anemia   . Anxiety   . History of chicken pox   . HSV (herpes simplex virus) anogenital infection   . IBS (irritable bowel syndrome)     Past Surgical History:  Procedure Laterality Date   . BREAST ENHANCEMENT SURGERY     2013  . CESAREAN SECTION N/A 05/20/2019   Procedure: CESAREAN SECTION;  Surgeon: Brien Few, MD;  Location: [facility];  Service: Obstetrics;  Laterality: N/A;  . TONSILLECTOMY     2009  . WISDOM TOOTH EXTRACTION     08.2015    No Known Allergies  Social History   Socioeconomic History  . Marital status: Married    Spouse name: Abe People  . Number of children: Not on file  . Years of education: Not on file  . Highest education level: Not on file  Occupational History  . Occupation: NP  Social Needs  . Financial resource strain: Not hard at all  . Food insecurity    Worry: Never true    Inability: Never true  . Transportation needs    Medical: No    Non-medical: No  Tobacco Use  . Smoking status: Never Smoker  . Smokeless tobacco: Never Used  Substance and Sexual Activity  . Alcohol use: Yes    Comment: socially, not while preg  . Drug use: No  . Sexual activity: Yes    Birth control/protection: None  Lifestyle  . Physical activity    Days per week: Patient refused    Minutes per session: Patient refused  . Stress: Rather  much  Relationships  . Social Musician on phone: Patient refused    Gets together: Patient refused    Attends religious service: Patient refused    Active member of club or organization: Patient refused    Attends meetings of clubs or organizations: Patient refused    Relationship status: Patient refused  . Intimate partner violence    Fear of current or ex partner: No    Emotionally abused: No    Physically abused: No    Forced sexual activity: No  Other Topics Concern  . Not on file  Social History Narrative  . Not on file     Family History  Problem Relation Age of Onset  . Hypertension Father        Living  . Obesity Father   . Alcoholism Father   . Alcohol abuse Father   . Hyperlipidemia Father   . Heart disease Paternal Grandfather   . Diabetes Paternal Grandfather   .  Hyperlipidemia Paternal Grandfather   . Heart attack Paternal Grandfather   . Hypertension Paternal Grandfather   . COPD Paternal Grandfather   . Breast cancer Paternal Grandmother   . Depression Paternal Grandmother   . Alcoholism Mother   . Alcohol abuse Mother   . Skin cancer Maternal Grandmother   . Cancer Maternal Grandmother   . Renal Disease Maternal Grandfather   . Bipolar disorder Maternal Aunt   . Healthy Brother        x1  . Drug abuse Paternal Uncle   . Early death Paternal Uncle      Review of Systems Constitutional: negative for anorexia, fevers and sweats , 10 to 15 pound weight gain during pregnancy Eyes: negative for irritation, redness and visual disturbance  Ears, nose, mouth, throat, and face: negative for earaches, epistaxis, nasal congestion and sore throat  Respiratory: negative for cough, dyspnea on exertion, sputum and wheezing  Cardiovascular: negative for chest pain, dyspnea, lower extremity edema, orthopnea, palpitations and syncope  Gastrointestinal: negative for abdominal pain, constipation, diarrhea, melena, nausea and vomiting  Genitourinary:negative for dysuria, frequency and hematuria  Hematologic/lymphatic: negative for bleeding, easy bruising and lymphadenopathy  Musculoskeletal:negative for arthralgias, muscle weakness and stiff joints  Neurological: negative for coordination problems, gait problems and weakness positive for rare a.m. headaches, anxiety Endocrine: negative for diabetic symptoms including polydipsia, polyuria and weight loss     Objective:   Physical Exam  Gen. Pleasant, well-nourished, in no distress, normal affect ENT - no pallor,icterus, no post nasal drip, class 2 airway Neck: No JVD, no thyromegaly, no carotid bruits Lungs: no use of accessory muscles, no dullness to percussion, clear without rales or rhonchi  Cardiovascular: Rhythm regular, heart sounds  normal, no murmurs or gallops, no peripheral edema Abdomen: soft  and non-tender, no hepatosplenomegaly, BS normal. Musculoskeletal: No deformities, no cyanosis or clubbing Neuro:  alert, non focal       Assessment & Plan:

## 2019-10-19 ENCOUNTER — Other Ambulatory Visit (HOSPITAL_COMMUNITY)

## 2019-10-21 ENCOUNTER — Encounter (HOSPITAL_BASED_OUTPATIENT_CLINIC_OR_DEPARTMENT_OTHER): Admitting: Pulmonary Disease

## 2019-10-22 ENCOUNTER — Encounter (HOSPITAL_BASED_OUTPATIENT_CLINIC_OR_DEPARTMENT_OTHER): Admitting: Pulmonary Disease

## 2020-01-01 ENCOUNTER — Other Ambulatory Visit: Payer: Self-pay | Admitting: Emergency Medicine

## 2020-01-01 DIAGNOSIS — F411 Generalized anxiety disorder: Secondary | ICD-10-CM

## 2020-01-01 MED ORDER — FLUOXETINE HCL 40 MG PO CAPS: 40.0000 mg | ORAL_CAPSULE | Freq: Every day | ORAL | 1 refills | Status: DC

## 2020-04-28 ENCOUNTER — Encounter: Payer: Self-pay | Admitting: Physician Assistant

## 2020-05-01 ENCOUNTER — Other Ambulatory Visit: Payer: Self-pay

## 2020-05-01 ENCOUNTER — Ambulatory Visit

## 2020-05-06 ENCOUNTER — Encounter: Payer: Self-pay | Admitting: Physician Assistant

## 2021-04-24 ENCOUNTER — Ambulatory Visit
Admission: RE | Admit: 2021-04-24 | Discharge: 2021-04-24 | Disposition: A | Discharge status: Home / Self Care | Source: Ambulatory Visit

## 2021-04-24 ENCOUNTER — Other Ambulatory Visit: Payer: Self-pay

## 2021-04-24 VITALS — BP 100/65 | HR 76 | Temp 98.0°F | Resp 12

## 2021-04-24 DIAGNOSIS — B370 Candidal stomatitis: Secondary | ICD-10-CM

## 2021-04-24 DIAGNOSIS — Z331 Pregnant state, incidental: Secondary | ICD-10-CM

## 2021-04-24 MED ORDER — NYSTATIN 100000 UNIT/ML MT SUSP: 5.0000 mL | Freq: Four times a day (QID) | OROMUCOSAL | 0 refills | Status: AC

## 2021-04-24 MED ORDER — NYSTATIN 100000 UNIT/ML MT SUSP: 5.0000 mL | Freq: Four times a day (QID) | OROMUCOSAL | 0 refills | Status: DC

## 2021-04-24 NOTE — ED Triage Notes (Signed)
States she has thrush in her mouth x a couple of weeks.

## 2021-04-24 NOTE — ED Provider Notes (Signed)
[facility]    CSN: [id number] Arrival date & time: 04/24/21  9470      History   Chief Complaint No chief complaint on file.   HPI Dana Serrano is a 30 y.o. female.   Reports that she recently finished an antibiotic for sinus infection and then had vaginal yeast as well as oral yeast.  Has been treated for vaginal yeast.  States that the oral yeast has returned.  Reports that her tongue is white and she has a bad taste in her mouth.  Has not attempted OTC treatment.  There are no aggravating or alleviating factors.  She does report that she is [redacted] weeks pregnant.  Denies headache, cough, shortness of breath, nausea, vomiting, diarrhea, rash, fever, other symptoms.  ROS per HPI  The history is provided by the patient.    Past Medical History:  Diagnosis Date  . Anemia   . Anxiety   . History of chicken pox   . HSV (herpes simplex virus) anogenital infection   . IBS (irritable bowel syndrome)     Patient Active Problem List   Diagnosis Date Noted  . Hypersomnolence 10/04/2019  . Postpartum care following cesarean delivery (6/22) 05/20/2019  . Normal labor 05/19/2019  . Generalized anxiety disorder 03/03/2017  . Immunity status testing 05/06/2015  . Lactose intolerance 05/02/2013    Past Surgical History:  Procedure Laterality Date  . BREAST ENHANCEMENT SURGERY     2013  . CESAREAN SECTION N/A 05/20/2019   Procedure: CESAREAN SECTION;  Surgeon: Olivia Mackie, MD;  Location: [facility];  Service: Obstetrics;  Laterality: N/A;  . TONSILLECTOMY     2009  . WISDOM TOOTH EXTRACTION     08.2015    OB History    Gravida  2   Para  1   Term  1   Preterm      AB      Living  1     SAB      IAB      Ectopic      Multiple  0   Live Births  1            Home Medications    Prior to Admission medications   Medication Sig Start Date End Date Taking? Authorizing Provider  magnesium 30 MG tablet Take 30 mg by mouth once.   Yes  [provider]  ferrous sulfate 325 (65 FE) MG tablet Take 1 tablet (325 mg total) by mouth 2 (two) times daily with a meal. 05/23/19   Sigmon, Scarlette Slice, CNM  FLUoxetine (PROZAC) 40 MG capsule Take 1 capsule (40 mg total) by mouth daily. Patient taking differently: Take 20 mg by mouth daily. 01/01/20   Waldon Merl, PA-C  magic mouthwash (nystatin, diphenhydrAMINE, alum & mag hydroxide) suspension mixture Swish and spit 5 mLs 4 (four) times daily for 5 days. 04/24/21 04/29/21  Moshe Cipro, NP  norethindrone (MICRONOR) 0.35 MG tablet Take 1 tablet by mouth daily. 09/16/19   [provider]  Prenatal Vit-Fe Fumarate-FA (PRENATAL 19) tablet Chew 1 tablet by mouth daily.    [provider]    Family History Family History  Problem Relation Age of Onset  . Hypertension Father        Living  . Obesity Father   . Alcoholism Father   . Alcohol abuse Father   . Hyperlipidemia Father   . Heart disease Paternal Grandfather   . Diabetes Paternal Grandfather   .  Hyperlipidemia Paternal Grandfather   . Heart attack Paternal Grandfather   . Hypertension Paternal Grandfather   . COPD Paternal Grandfather   . Breast cancer Paternal Grandmother   . Depression Paternal Grandmother   . Alcoholism Mother   . Alcohol abuse Mother   . Skin cancer Maternal Grandmother   . Cancer Maternal Grandmother   . Renal Disease Maternal Grandfather   . Bipolar disorder Maternal Aunt   . Healthy Brother        x1  . Drug abuse Paternal Uncle   . Early death Paternal Uncle     Social History Social History   Tobacco Use  . Smoking status: Never Smoker  . Smokeless tobacco: Never Used  Vaping Use  . Vaping Use: Never used  Substance Use Topics  . Alcohol use: Yes    Comment: socially, not while preg  . Drug use: No     Allergies   Patient has no known allergies.   Review of Systems Review of Systems   Physical Exam Triage Vital Signs ED Triage Vitals   Enc Vitals Group     BP 04/24/21 0841 100/65     Pulse Rate 04/24/21 0841 76     Resp 04/24/21 0841 12     Temp 04/24/21 0841 98 F (36.7 C)     Temp Source 04/24/21 0841 Oral     SpO2 04/24/21 0841 96 %     Weight --      Height --      Head Circumference --      Peak Flow --      Pain Score 04/24/21 0846 0     Pain Loc --      Pain Edu? --      Excl. in GC? --    No data found.  Updated Vital Signs BP 100/65 (BP Location: Right Arm)   Pulse 76   Temp 98 F (36.7 C) (Oral)   Resp 12   SpO2 96%   Visual Acuity Right Eye Distance:   Left Eye Distance:   Bilateral Distance:    Right Eye Near:   Left Eye Near:    Bilateral Near:     Physical Exam Vitals and nursing note reviewed.  Constitutional:      General: She is not in acute distress.    Appearance: Normal appearance. She is well-developed and normal weight. She is not ill-appearing.  HENT:     Head: Normocephalic and atraumatic.     Nose: Nose normal.     Mouth/Throat:     Mouth: Mucous membranes are moist.     Comments: Thick white covering over the back of the tongue, consistent with oral yeast Eyes:     Extraocular Movements: Extraocular movements intact.     Conjunctiva/sclera: Conjunctivae normal.     Pupils: Pupils are equal, round, and reactive to light.  Cardiovascular:     Rate and Rhythm: Normal rate and regular rhythm.  Pulmonary:     Effort: Pulmonary effort is normal.  Abdominal:     Comments: Gravid  Musculoskeletal:        General: Normal range of motion.     Cervical back: Normal range of motion and neck supple.  Skin:    General: Skin is warm and dry.     Capillary Refill: Capillary refill takes less than 2 seconds.  Neurological:     Mental Status: She is alert.  Psychiatric:        Mood and  Affect: Mood normal.        Behavior: Behavior normal.        Thought Content: Thought content normal.      UC Treatments / Results  Labs (all labs ordered are listed, but only  abnormal results are displayed) Labs Reviewed - No data to display  EKG   Radiology No results found.  Procedures Procedures (including critical care time)  Medications Ordered in UC Medications - No data to display  Initial Impression / Assessment and Plan / UC Course  I have reviewed the triage vital signs and the nursing notes.  Pertinent labs & imaging results that were available during my care of the patient were reviewed by me and considered in my medical decision making (see chart for details).    Oral thrush  Magic mouthwash prescribed to use 5 mils to swish and spit every 6 hours for 5 days Follow up with this office or with primary care if symptoms are persisting.  Follow up in the ER for high fever, trouble swallowing, trouble breathing, other concerning symptoms.    Final Clinical Impressions(s) / UC Diagnoses   Final diagnoses:  Oral thrush  Pregnant state, incidental     Discharge Instructions     Magic mouthwash prescribed to use 89mL to swish and spit every 6 hours for 5 days  Follow up with this office or with primary care if symptoms are persisting.  Follow up in the ER for high fever, trouble swallowing, trouble breathing, other concerning symptoms.       ED Prescriptions    Medication Sig Dispense Auth. Provider   magic mouthwash (nystatin, diphenhydrAMINE, alum & mag hydroxide) suspension mixture  (Status: Discontinued) Swish and spit 5 mLs 4 (four) times daily for 5 days. 240 mL Moshe Cipro, NP   magic mouthwash (nystatin, diphenhydrAMINE, alum & mag hydroxide) suspension mixture Swish and spit 5 mLs 4 (four) times daily for 5 days. 240 mL Moshe Cipro, NP     PDMP not reviewed this encounter.   Moshe Cipro, NP 04/24/21 [phone]

## 2021-04-24 NOTE — Discharge Instructions (Addendum)
Magic mouthwash prescribed to use 35mL to swish and spit every 6 hours for 5 days  Follow up with this office or with primary care if symptoms are persisting.  Follow up in the ER for high fever, trouble swallowing, trouble breathing, other concerning symptoms.

## 2021-06-29 ENCOUNTER — Other Ambulatory Visit: Payer: Self-pay | Admitting: Obstetrics

## 2021-07-16 ENCOUNTER — Telehealth (HOSPITAL_COMMUNITY): Payer: Self-pay | Admitting: *Deleted

## 2021-07-16 NOTE — Telephone Encounter (Signed)
Preadmission screen  

## 2021-07-16 NOTE — Patient Instructions (Addendum)
Dana Serrano  07/16/2021   Your procedure is scheduled on:  07/29/2021  Arrive at 1130 at [facility] on [address] at [facility]  and [address]. You are invited to use the FREE valet parking or use the Visitor's parking deck.  Pick up the phone at the desk and dial [phone].  Call this number if you have problems the morning of surgery: [phone]  Remember:   Do not eat food:(After Midnight) Desps de medianoche.  Do not drink clear liquids: (After Midnight) Desps de medianoche.  Take these medicines the morning of surgery with A SIP OF WATER:  Fluoxitine as prescribed   Do not wear jewelry, make-up or nail polish.  Do not wear lotions, powders, or perfumes. Do not wear deodorant.  Do not shave 48 hours prior to surgery.  Do not bring valuables to the hospital.  [facility] is not   responsible for any belongings or valuables brought to the hospital.  Contacts, dentures or bridgework may not be worn into surgery.  Leave suitcase in the car. After surgery it may be brought to your room.  For patients admitted to the hospital, checkout time is 11:00 AM the day of              discharge.      Please read over the following fact sheets that you were given:     Preparing for Surgery

## 2021-07-20 ENCOUNTER — Encounter (HOSPITAL_COMMUNITY): Payer: Self-pay

## 2021-07-27 ENCOUNTER — Encounter (HOSPITAL_COMMUNITY)
Admission: RE | Admit: 2021-07-27 | Discharge: 2021-07-27 | Disposition: A | Discharge status: Home / Self Care | Source: Ambulatory Visit | Attending: Obstetrics | Admitting: Obstetrics

## 2021-07-27 ENCOUNTER — Other Ambulatory Visit: Payer: Self-pay | Admitting: Obstetrics

## 2021-07-27 ENCOUNTER — Other Ambulatory Visit: Payer: Self-pay

## 2021-07-27 DIAGNOSIS — Z01812 Encounter for preprocedural laboratory examination: Secondary | ICD-10-CM | POA: Insufficient documentation

## 2021-07-27 LAB — TYPE AND SCREEN
ABO/RH(D): O POS
Antibody Screen: NEGATIVE

## 2021-07-27 LAB — CBC
HCT: 38.2 % (ref 36.0–46.0)
Hemoglobin: 13.1 g/dL (ref 12.0–15.0)
MCH: 31 pg (ref 26.0–34.0)
MCHC: 34.3 g/dL (ref 30.0–36.0)
MCV: 90.3 fL (ref 80.0–100.0)
Platelets: 153 10*3/uL (ref 150–400)
RBC: 4.23 MIL/uL (ref 3.87–5.11)
RDW: 13.8 % (ref 11.5–15.5)
WBC: 10.2 10*3/uL (ref 4.0–10.5)
nRBC: 0 % (ref 0.0–0.2)

## 2021-07-27 LAB — SARS CORONAVIRUS 2 (TAT 6-24 HRS): SARS Coronavirus 2: NEGATIVE

## 2021-07-27 LAB — RPR: RPR Ser Ql: NONREACTIVE

## 2021-07-28 ENCOUNTER — Encounter (HOSPITAL_COMMUNITY): Payer: Self-pay | Admitting: Obstetrics

## 2021-07-28 NOTE — H&P (Signed)
ARABELLE BOLLIG is a 30 y.o. G2P1001 at [redacted]w[redacted]d presenting for RCS. Pt notes occasional contractions. Good fetal movement, No vaginal bleeding, not leaking fluid.  PNCare at [facility] since 8 wks - dated by LMP c/w 8 wk u/s - h/o PPD, on Zoloft through preg - prior c/s for arrest, plan RCS - high wt gain- 40#, DS 133, baby AGA, nl home BS testing -recurrent oral thrush in preg - husband military, deploying 08/12/21   Prenatal Transfer Tool  Maternal Diabetes: No Genetic Screening: Normal Maternal Ultrasounds/Referrals: Normal Fetal Ultrasounds or other Referrals:  None Maternal Substance Abuse:  No Significant Maternal Medications:  None Significant Maternal Lab Results: Group B Strep negative     OB History     Gravida  2   Para  1   Term  1   Preterm      AB      Living  1      SAB      IAB      Ectopic      Multiple  0   Live Births  1          Past Medical History:  Diagnosis Date   Anemia    Anxiety    History of chicken pox    HSV (herpes simplex virus) anogenital infection    oral only   IBS (irritable bowel syndrome)    Past Surgical History:  Procedure Laterality Date   BREAST ENHANCEMENT SURGERY     2013   CESAREAN SECTION N/A 05/20/2019   Procedure: CESAREAN SECTION;  Surgeon: Olivia Mackie, MD;  Location: [facility] LD ORS;  Service: Obstetrics;  Laterality: N/A;   TONSILLECTOMY     2009   WISDOM TOOTH EXTRACTION     08.2015   Family History: family history includes Alcohol abuse in her father and mother; Alcoholism in her father and mother; Bipolar disorder in her maternal aunt; Breast cancer in her paternal grandmother; COPD in her paternal grandfather; Cancer in her maternal grandmother; Depression in her paternal grandmother; Diabetes in her paternal grandfather; Drug abuse in her paternal uncle; Early death in her paternal uncle; Healthy in her brother; Heart attack in her paternal grandfather; Heart disease in her paternal  grandfather; Hyperlipidemia in her father and paternal grandfather; Hypertension in her father and paternal grandfather; Obesity in her father; Renal Disease in her maternal grandfather; Skin cancer in her maternal grandmother. Social History:  reports that she has never smoked. She has never used smokeless tobacco. She reports current alcohol use. She reports that she does not use drugs.  Review of Systems - Negative except discomfort of preg     currently breastfeeding.  Physical Exam:  Vitals:   07/29/21 1240  BP: 119/73  Pulse: 72  Resp: 20  Temp: 97.7 F (36.5 C)  TempSrc: Oral  SpO2: 99%  Weight: 93.9 kg  Height: 5' 4.5" (1.638 m)    Gen: well appearing, no distress  Back: no CVAT Abd: gravid, NT, no RUQ pain LE: no edema, equal bilaterally, non-tender  Prenatal labs: ABO, Rh: --/--/O POS (08/30 0940) Antibody: NEG (08/30 0940) Rubella:  immune RPR: NON REACTIVE (08/30 0936)  HBsAg:   neg HIV:   neg GBS:   neg 1 hr Glucola 133  Genetic screening nl Panorama Anatomy US normal  CBC    Component Value Date/Time   WBC 10.2 07/27/2021 0936   RBC 4.23 07/27/2021 0936   HGB 13.1 07/27/2021 0936   HCT 38.2  07/27/2021 0936   PLT 153 07/27/2021 0936   MCV 90.3 07/27/2021 0936   MCH 31.0 07/27/2021 0936   MCHC 34.3 07/27/2021 0936   RDW 13.8 07/27/2021 0936   LYMPHSABS 1.7 09/20/2019 1332   MONOABS 0.5 09/20/2019 1332   EOSABS 0.4 09/20/2019 1332   BASOSABS 0.0 09/20/2019 1332     Assessment/Plan: 30 y.o. G2P1001 at [redacted]w[redacted]d - RCS - h/o PPD Cont Zoloft   Lendon Colonel 07/28/2021, 5:37 PM  Lendon Colonel 07/29/2021 1:35 PM

## 2021-07-29 ENCOUNTER — Inpatient Hospital Stay (HOSPITAL_COMMUNITY): Admitting: Anesthesiology

## 2021-07-29 ENCOUNTER — Inpatient Hospital Stay (HOSPITAL_COMMUNITY)
Admission: RE | Admit: 2021-07-29 | Discharge: 2021-07-31 | DRG: 787 | Disposition: A | Discharge status: Home / Self Care | Attending: Obstetrics | Admitting: Obstetrics

## 2021-07-29 ENCOUNTER — Encounter (HOSPITAL_COMMUNITY)
Admission: RE | Disposition: A | Discharge status: Home / Self Care | Payer: Self-pay | Source: Home / Self Care | Attending: Obstetrics

## 2021-07-29 ENCOUNTER — Other Ambulatory Visit: Payer: Self-pay

## 2021-07-29 ENCOUNTER — Encounter (HOSPITAL_COMMUNITY): Payer: Self-pay | Admitting: Obstetrics

## 2021-07-29 DIAGNOSIS — O99344 Other mental disorders complicating childbirth: Secondary | ICD-10-CM | POA: Diagnosis present

## 2021-07-29 DIAGNOSIS — O9081 Anemia of the puerperium: Secondary | ICD-10-CM | POA: Diagnosis not present

## 2021-07-29 DIAGNOSIS — Z3A39 39 weeks gestation of pregnancy: Secondary | ICD-10-CM | POA: Diagnosis not present

## 2021-07-29 DIAGNOSIS — O34211 Maternal care for low transverse scar from previous cesarean delivery: Principal | ICD-10-CM | POA: Diagnosis present

## 2021-07-29 DIAGNOSIS — F32A Depression, unspecified: Secondary | ICD-10-CM | POA: Diagnosis present

## 2021-07-29 DIAGNOSIS — F411 Generalized anxiety disorder: Secondary | ICD-10-CM | POA: Diagnosis present

## 2021-07-29 DIAGNOSIS — D62 Acute posthemorrhagic anemia: Secondary | ICD-10-CM | POA: Diagnosis not present

## 2021-07-29 DIAGNOSIS — O99214 Obesity complicating childbirth: Secondary | ICD-10-CM | POA: Diagnosis present

## 2021-07-29 DIAGNOSIS — Z98891 History of uterine scar from previous surgery: Secondary | ICD-10-CM

## 2021-07-29 SURGERY — Surgical Case
Anesthesia: Spinal | Wound class: Clean Contaminated

## 2021-07-29 MED ORDER — ZOLPIDEM TARTRATE 5 MG PO TABS: 5.0000 mg | ORAL_TABLET | Freq: Every evening | ORAL | Status: DC | PRN

## 2021-07-29 MED ORDER — ACETAMINOPHEN 10 MG/ML IV SOLN
INTRAVENOUS | Status: DC | PRN
  Administered 2021-07-29: 1000 mg via INTRAVENOUS

## 2021-07-29 MED ORDER — PHENYLEPHRINE HCL-NACL 20-0.9 MG/250ML-% IV SOLN
INTRAVENOUS | Status: AC
  Filled 2021-07-29: qty 250

## 2021-07-29 MED ORDER — CEFAZOLIN SODIUM-DEXTROSE 2-4 GM/100ML-% IV SOLN
2.0000 g | INTRAVENOUS | Status: AC
  Administered 2021-07-29: 2 g via INTRAVENOUS

## 2021-07-29 MED ORDER — DIPHENHYDRAMINE HCL 25 MG PO CAPS: 25.0000 mg | ORAL_CAPSULE | ORAL | Status: DC | PRN

## 2021-07-29 MED ORDER — DIPHENHYDRAMINE HCL 25 MG PO CAPS: 25.0000 mg | ORAL_CAPSULE | Freq: Four times a day (QID) | ORAL | Status: DC | PRN

## 2021-07-29 MED ORDER — DIPHENHYDRAMINE HCL 50 MG/ML IJ SOLN: 12.5000 mg | INTRAMUSCULAR | Status: DC | PRN

## 2021-07-29 MED ORDER — DEXAMETHASONE SODIUM PHOSPHATE 4 MG/ML IJ SOLN
INTRAMUSCULAR | Status: AC
  Filled 2021-07-29: qty 1

## 2021-07-29 MED ORDER — KETOROLAC TROMETHAMINE 30 MG/ML IJ SOLN: 30.0000 mg | Freq: Four times a day (QID) | INTRAMUSCULAR | Status: AC | PRN

## 2021-07-29 MED ORDER — ONDANSETRON HCL 4 MG/2ML IJ SOLN: 4.0000 mg | Freq: Three times a day (TID) | INTRAMUSCULAR | Status: DC | PRN

## 2021-07-29 MED ORDER — MORPHINE SULFATE (PF) 0.5 MG/ML IJ SOLN
INTRAMUSCULAR | Status: DC | PRN
  Administered 2021-07-29: .15 mg via INTRATHECAL

## 2021-07-29 MED ORDER — PRENATAL MULTIVITAMIN CH
1.0000 | ORAL_TABLET | Freq: Every day | ORAL | Status: DC
  Administered 2021-07-30 – 2021-07-31 (×2): 1 via ORAL
  Filled 2021-07-29 (×2): qty 1

## 2021-07-29 MED ORDER — NALBUPHINE HCL 10 MG/ML IJ SOLN: 5.0000 mg | INTRAMUSCULAR | Status: DC | PRN

## 2021-07-29 MED ORDER — ONDANSETRON HCL 4 MG/2ML IJ SOLN
INTRAMUSCULAR | Status: AC
  Filled 2021-07-29: qty 2

## 2021-07-29 MED ORDER — NALOXONE HCL 4 MG/10ML IJ SOLN
1.0000 ug/kg/h | INTRAVENOUS | Status: DC | PRN
  Filled 2021-07-29: qty 5

## 2021-07-29 MED ORDER — PROMETHAZINE HCL 25 MG/ML IJ SOLN: 6.2500 mg | INTRAMUSCULAR | Status: DC | PRN

## 2021-07-29 MED ORDER — DEXAMETHASONE SODIUM PHOSPHATE 10 MG/ML IJ SOLN: INTRAMUSCULAR | Status: DC | PRN

## 2021-07-29 MED ORDER — LACTATED RINGERS IV SOLN: INTRAVENOUS | Status: DC

## 2021-07-29 MED ORDER — IBUPROFEN 600 MG PO TABS
600.0000 mg | ORAL_TABLET | Freq: Four times a day (QID) | ORAL | Status: DC
  Administered 2021-07-30 – 2021-07-31 (×4): 600 mg via ORAL
  Filled 2021-07-29 (×4): qty 1

## 2021-07-29 MED ORDER — NALBUPHINE HCL 10 MG/ML IJ SOLN: 5.0000 mg | Freq: Once | INTRAMUSCULAR | Status: DC | PRN

## 2021-07-29 MED ORDER — MENTHOL 3 MG MT LOZG: 1.0000 | LOZENGE | OROMUCOSAL | Status: DC | PRN

## 2021-07-29 MED ORDER — MORPHINE SULFATE (PF) 0.5 MG/ML IJ SOLN
INTRAMUSCULAR | Status: AC
  Filled 2021-07-29: qty 10

## 2021-07-29 MED ORDER — WITCH HAZEL-GLYCERIN EX PADS: 1.0000 "application " | MEDICATED_PAD | CUTANEOUS | Status: DC | PRN

## 2021-07-29 MED ORDER — KETOROLAC TROMETHAMINE 30 MG/ML IJ SOLN: 30.0000 mg | Freq: Once | INTRAMUSCULAR | Status: DC | PRN

## 2021-07-29 MED ORDER — MEPERIDINE HCL 25 MG/ML IJ SOLN: 6.2500 mg | INTRAMUSCULAR | Status: DC | PRN

## 2021-07-29 MED ORDER — DIBUCAINE (PERIANAL) 1 % EX OINT: 1.0000 "application " | TOPICAL_OINTMENT | CUTANEOUS | Status: DC | PRN

## 2021-07-29 MED ORDER — PHENYLEPHRINE HCL-NACL 20-0.9 MG/250ML-% IV SOLN
INTRAVENOUS | Status: DC | PRN
  Administered 2021-07-29: 60 ug/min via INTRAVENOUS

## 2021-07-29 MED ORDER — ACETAMINOPHEN 10 MG/ML IV SOLN
INTRAVENOUS | Status: AC
  Filled 2021-07-29: qty 100

## 2021-07-29 MED ORDER — OXYCODONE HCL 5 MG PO TABS
5.0000 mg | ORAL_TABLET | ORAL | Status: DC | PRN
  Administered 2021-07-30 – 2021-07-31 (×5): 10 mg via ORAL
  Filled 2021-07-29 (×5): qty 2

## 2021-07-29 MED ORDER — OXYTOCIN-SODIUM CHLORIDE 30-0.9 UT/500ML-% IV SOLN
INTRAVENOUS | Status: AC
  Filled 2021-07-29: qty 1000

## 2021-07-29 MED ORDER — KETOROLAC TROMETHAMINE 30 MG/ML IJ SOLN
30.0000 mg | Freq: Four times a day (QID) | INTRAMUSCULAR | Status: AC
  Administered 2021-07-29 – 2021-07-30 (×4): 30 mg via INTRAVENOUS
  Filled 2021-07-29 (×4): qty 1

## 2021-07-29 MED ORDER — OXYTOCIN-SODIUM CHLORIDE 30-0.9 UT/500ML-% IV SOLN: 2.5000 [IU]/h | INTRAVENOUS | Status: AC

## 2021-07-29 MED ORDER — FENTANYL CITRATE (PF) 100 MCG/2ML IJ SOLN: INTRAMUSCULAR | Status: DC | PRN

## 2021-07-29 MED ORDER — OXYTOCIN-SODIUM CHLORIDE 30-0.9 UT/500ML-% IV SOLN
INTRAVENOUS | Status: DC | PRN
  Administered 2021-07-29: 400 mL via INTRAVENOUS

## 2021-07-29 MED ORDER — DEXAMETHASONE SODIUM PHOSPHATE 4 MG/ML IJ SOLN
INTRAMUSCULAR | Status: DC | PRN
  Administered 2021-07-29: 4 mg via INTRAVENOUS

## 2021-07-29 MED ORDER — SIMETHICONE 80 MG PO CHEW
80.0000 mg | CHEWABLE_TABLET | Freq: Three times a day (TID) | ORAL | Status: DC
  Administered 2021-07-29 – 2021-07-31 (×5): 80 mg via ORAL
  Filled 2021-07-29 (×5): qty 1

## 2021-07-29 MED ORDER — NALOXONE HCL 0.4 MG/ML IJ SOLN: 0.4000 mg | INTRAMUSCULAR | Status: DC | PRN

## 2021-07-29 MED ORDER — SODIUM CHLORIDE 0.9% FLUSH: 3.0000 mL | INTRAVENOUS | Status: DC | PRN

## 2021-07-29 MED ORDER — COCONUT OIL OIL: 1.0000 "application " | TOPICAL_OIL | Status: DC | PRN

## 2021-07-29 MED ORDER — CEFAZOLIN SODIUM-DEXTROSE 2-4 GM/100ML-% IV SOLN
INTRAVENOUS | Status: AC
  Filled 2021-07-29: qty 100

## 2021-07-29 MED ORDER — FENTANYL CITRATE (PF) 100 MCG/2ML IJ SOLN
INTRAMUSCULAR | Status: AC
  Filled 2021-07-29: qty 2

## 2021-07-29 MED ORDER — ONDANSETRON HCL 4 MG/2ML IJ SOLN
INTRAMUSCULAR | Status: DC | PRN
  Administered 2021-07-29: 4 mg via INTRAVENOUS

## 2021-07-29 MED ORDER — SENNOSIDES-DOCUSATE SODIUM 8.6-50 MG PO TABS
2.0000 | ORAL_TABLET | ORAL | Status: DC
  Administered 2021-07-30: 2 via ORAL
  Filled 2021-07-29: qty 2

## 2021-07-29 MED ORDER — MORPHINE SULFATE (PF) 0.5 MG/ML IJ SOLN: INTRAMUSCULAR | Status: DC | PRN

## 2021-07-29 MED ORDER — HYDROMORPHONE HCL 1 MG/ML IJ SOLN: 0.2500 mg | INTRAMUSCULAR | Status: DC | PRN

## 2021-07-29 MED ORDER — FENTANYL CITRATE (PF) 100 MCG/2ML IJ SOLN
INTRAMUSCULAR | Status: DC | PRN
  Administered 2021-07-29: 15 ug via INTRATHECAL

## 2021-07-29 MED ORDER — BUPIVACAINE IN DEXTROSE 0.75-8.25 % IT SOLN
INTRATHECAL | Status: DC | PRN
  Administered 2021-07-29: 1.6 mL via INTRATHECAL

## 2021-07-29 MED ORDER — SIMETHICONE 80 MG PO CHEW: 80.0000 mg | CHEWABLE_TABLET | ORAL | Status: DC | PRN

## 2021-07-29 MED ORDER — OXYTOCIN-SODIUM CHLORIDE 30-0.9 UT/500ML-% IV SOLN
INTRAVENOUS | Status: AC
  Filled 2021-07-29: qty 500

## 2021-07-29 MED ORDER — POVIDONE-IODINE 10 % EX SWAB
2.0000 "application " | Freq: Once | CUTANEOUS | Status: AC
  Administered 2021-07-29: 2 via TOPICAL

## 2021-07-29 MED ORDER — ACETAMINOPHEN 500 MG PO TABS
1000.0000 mg | ORAL_TABLET | Freq: Four times a day (QID) | ORAL | Status: AC
  Administered 2021-07-29 – 2021-07-30 (×3): 1000 mg via ORAL
  Filled 2021-07-29 (×2): qty 2

## 2021-07-29 MED ORDER — TETANUS-DIPHTH-ACELL PERTUSSIS 5-2.5-18.5 LF-MCG/0.5 IM SUSY: 0.5000 mL | PREFILLED_SYRINGE | Freq: Once | INTRAMUSCULAR | Status: DC

## 2021-07-29 MED ORDER — SCOPOLAMINE 1 MG/3DAYS TD PT72: 1.0000 | MEDICATED_PATCH | Freq: Once | TRANSDERMAL | Status: DC

## 2021-07-29 MED ORDER — ACETAMINOPHEN 500 MG PO TABS
1000.0000 mg | ORAL_TABLET | Freq: Four times a day (QID) | ORAL | Status: DC
  Administered 2021-07-30 – 2021-07-31 (×4): 1000 mg via ORAL
  Filled 2021-07-29 (×5): qty 2

## 2021-07-29 MED ORDER — FLUOXETINE HCL 20 MG PO CAPS
20.0000 mg | ORAL_CAPSULE | Freq: Every day | ORAL | Status: DC
  Administered 2021-07-30 – 2021-07-31 (×2): 20 mg via ORAL
  Filled 2021-07-29 (×3): qty 1

## 2021-07-29 SURGICAL SUPPLY — 34 items
APL SKNCLS STERI-STRIP NONHPOA (GAUZE/BANDAGES/DRESSINGS) ×1
BENZOIN TINCTURE PRP APPL 2/3 (GAUZE/BANDAGES/DRESSINGS) ×1 IMPLANT
CHLORAPREP W/TINT 26ML (MISCELLANEOUS) ×2 IMPLANT
CLAMP CORD UMBIL (MISCELLANEOUS) IMPLANT
CLOTH BEACON ORANGE TIMEOUT ST (SAFETY) ×2 IMPLANT
DRSG OPSITE POSTOP 4X10 (GAUZE/BANDAGES/DRESSINGS) ×2 IMPLANT
ELECT REM PT RETURN 9FT ADLT (ELECTROSURGICAL) ×2
ELECTRODE REM PT RTRN 9FT ADLT (ELECTROSURGICAL) ×1 IMPLANT
EXTRACTOR VACUUM M CUP 4 TUBE (SUCTIONS) IMPLANT
GLOVE BIO SURGEON STRL SZ 6.5 (GLOVE) ×2 IMPLANT
GLOVE BIOGEL PI IND STRL 7.0 (GLOVE) ×2 IMPLANT
GLOVE BIOGEL PI INDICATOR 7.0 (GLOVE) ×2
GOWN STRL REUS W/TWL LRG LVL3 (GOWN DISPOSABLE) ×4 IMPLANT
KIT ABG SYR 3ML LUER SLIP (SYRINGE) IMPLANT
NDL HYPO 25X5/8 SAFETYGLIDE (NEEDLE) IMPLANT
NEEDLE HYPO 22GX1.5 SAFETY (NEEDLE) IMPLANT
NEEDLE HYPO 25X5/8 SAFETYGLIDE (NEEDLE) IMPLANT
NS IRRIG 1000ML POUR BTL (IV SOLUTION) ×2 IMPLANT
PACK C SECTION WH (CUSTOM PROCEDURE TRAY) ×2 IMPLANT
PAD OB MATERNITY 4.3X12.25 (PERSONAL CARE ITEMS) ×2 IMPLANT
PENCIL SMOKE EVAC W/HOLSTER (ELECTROSURGICAL) ×2 IMPLANT
STRIP CLOSURE SKIN 1/2X4 (GAUZE/BANDAGES/DRESSINGS) ×1 IMPLANT
SUT MON AB 4-0 PS1 27 (SUTURE) ×2 IMPLANT
SUT PLAIN 0 NONE (SUTURE) IMPLANT
SUT PLAIN 2 0 XLH (SUTURE) IMPLANT
SUT VIC AB 0 CT1 36 (SUTURE) ×4 IMPLANT
SUT VIC AB 0 CTX 36 (SUTURE) ×4
SUT VIC AB 0 CTX36XBRD ANBCTRL (SUTURE) ×2 IMPLANT
SUT VIC AB 2-0 CT1 27 (SUTURE) ×2
SUT VIC AB 2-0 CT1 TAPERPNT 27 (SUTURE) ×1 IMPLANT
SYR CONTROL 10ML LL (SYRINGE) IMPLANT
TOWEL OR 17X24 6PK STRL BLUE (TOWEL DISPOSABLE) ×2 IMPLANT
TRAY FOLEY W/BAG SLVR 14FR LF (SET/KITS/TRAYS/PACK) IMPLANT
WATER STERILE IRR 1000ML POUR (IV SOLUTION) ×2 IMPLANT

## 2021-07-29 NOTE — Anesthesia Postprocedure Evaluation (Signed)
Anesthesia Post Note  Patient: Dana Serrano  Procedure(s) Performed: Repeat CESAREAN SECTION     Patient location during evaluation: PACU Anesthesia Type: Spinal Level of consciousness: awake Pain management: pain level controlled Vital Signs Assessment: post-procedure vital signs reviewed and stable Respiratory status: spontaneous breathing Cardiovascular status: stable Postop Assessment: no headache, no backache, spinal receding, patient able to bend at knees and no apparent nausea or vomiting Anesthetic complications: no   No notable events documented.  Last Vitals:  Vitals:   07/29/21 1600 07/29/21 1615  BP: (!) 97/52 108/66  Pulse: (!) 59 62  Resp: 10 17  Temp:  (!) 36.4 C  SpO2: 100% 100%    Last Pain:  Vitals:   07/29/21 1600  TempSrc:   PainSc: 0-No pain   Pain Goal: Patients Stated Pain Goal: 0 (07/29/21 1240)                 Caren Macadam

## 2021-07-29 NOTE — Anesthesia Procedure Notes (Signed)
Spinal  Patient location during procedure: OR Start time: 07/29/2021 1:45 PM End time: 07/29/2021 1:48 PM Reason for block: surgical anesthesia Staffing Performed: anesthesiologist  Anesthesiologist: Leilani Able, MD Preanesthetic Checklist Completed: patient identified, IV checked, site marked, risks and benefits discussed, surgical consent, monitors and equipment checked, pre-op evaluation and timeout performed Spinal Block Patient position: sitting Prep: DuraPrep and site prepped and draped Patient monitoring: continuous pulse ox and blood pressure Approach: midline Location: L3-4 Injection technique: single-shot Needle Needle type: Pencan  Needle gauge: 24 G Needle length: 10 cm Needle insertion depth: 5 cm Assessment Sensory level: T4 Events: CSF return

## 2021-07-29 NOTE — Anesthesia Preprocedure Evaluation (Signed)
Anesthesia Evaluation  Patient identified by MRN, date of birth, ID band Patient awake    Reviewed: Allergy & Precautions, NPO status , Patient's Chart, lab work & pertinent test results  History of Anesthesia Complications Negative for: history of anesthetic complications  Airway Mallampati: I       Dental no notable dental hx.    Pulmonary neg pulmonary ROS,    Pulmonary exam normal        Cardiovascular negative cardio ROS Normal cardiovascular exam     Neuro/Psych PSYCHIATRIC DISORDERS Anxiety negative neurological ROS     GI/Hepatic Neg liver ROS,  IBS    Endo/Other   Obesity   Renal/GU negative Renal ROS     Musculoskeletal negative musculoskeletal ROS (+)   Abdominal (+) + obese,   Peds  Hematology  PLT - 134k    Anesthesia Other Findings HSV  Reproductive/Obstetrics (+) Pregnancy                             Anesthesia Physical  Anesthesia Plan  ASA: II  Anesthesia Plan: Spinal   Post-op Pain Management:    Induction:   PONV Risk Score and Plan: 3 and Ondansetron, Dexamethasone, Scopolamine patch - Pre-op and Treatment may vary due to age or medical condition  Airway Management Planned: Natural Airway, Nasal Cannula and Simple Face Mask  Additional Equipment: None  Intra-op Plan:   Post-operative Plan:   Informed Consent: I have reviewed the patients History and Physical, chart, labs and discussed the procedure including the risks, benefits and alternatives for the proposed anesthesia with the patient or authorized representative who has indicated his/her understanding and acceptance.       Plan Discussed with: CRNA  Anesthesia Plan Comments:         Anesthesia Quick Evaluation

## 2021-07-29 NOTE — Transfer of Care (Signed)
Immediate Anesthesia Transfer of Care Note  Patient: Dana Serrano  Procedure(s) Performed: Repeat CESAREAN SECTION  Patient Location: PACU  Anesthesia Type:Spinal  Level of Consciousness: awake, alert  and oriented  Airway & Oxygen Therapy: Patient Spontanous Breathing  Post-op Assessment: Report given to RN and Post -op Vital signs reviewed and stable  Post vital signs: Reviewed and stable  Last Vitals:  Vitals Value Taken Time  BP 112/63 07/29/21 1507  Temp 36.4 C 07/29/21 1509  Pulse 63 07/29/21 1510  Resp 19 07/29/21 1510  SpO2 98 % 07/29/21 1510  Vitals shown include unvalidated device data.  Last Pain:  Vitals:   07/29/21 1240  TempSrc: Oral  PainSc: 3       Patients Stated Pain Goal: 0 (07/29/21 1240)  Complications: No notable events documented.

## 2021-07-29 NOTE — Brief Op Note (Signed)
07/29/2021  3:04 PM  PATIENT:  Dana Serrano  30 y.o. female  PRE-OPERATIVE DIAGNOSIS:  Previous Cesarean Section  POST-OPERATIVE DIAGNOSIS:  Previous Cesarean Section  PROCEDURE:  Procedure(s) with comments: Repeat CESAREAN SECTION (N/A) - EDD: 07/30/21  SURGEON:  Surgeon(s) and Role:    * Noland Fordyce, MD - Primary  PHYSICIAN ASSISTANT: Arlan Organ, CNM  ASSISTANTS: As above  ANESTHESIA:   spinal  EBL: Per nursing notes  BLOOD ADMINISTERED:none  DRAINS: Urinary Catheter (Foley)   LOCAL MEDICATIONS USED:  NONE  SPECIMEN: Placenta  DISPOSITION OF SPECIMEN:   Disposal  COUNTS:  YES  TOURNIQUET:  * No tourniquets in log *  DICTATION: .Note written in EPIC  PLAN OF CARE: Admit to inpatient   PATIENT DISPOSITION:  PACU - hemodynamically stable.   Delay start of Pharmacological VTE agent (>24hrs) due to surgical blood loss or risk of bleeding: yes

## 2021-07-29 NOTE — Op Note (Signed)
07/29/2021  3:04 PM  PATIENT:  Dana Serrano  30 y.o. female  PRE-OPERATIVE DIAGNOSIS:  Previous Cesarean Section  POST-OPERATIVE DIAGNOSIS:  Previous Cesarean Section  PROCEDURE:  Procedure(s) with comments: Repeat CESAREAN SECTION (N/A) - EDD: 07/30/21  SURGEON:  Surgeon(s) and Role:    * Noland Fordyce, MD - Primary  PHYSICIAN ASSISTANT: Arlan Organ, CNM  ASSISTANTS: As above  ANESTHESIA:   spinal  EBL: Per nursing notes  BLOOD ADMINISTERED:none  DRAINS: Urinary Catheter (Foley)   LOCAL MEDICATIONS USED:  NONE  SPECIMEN: Placenta  DISPOSITION OF SPECIMEN:  Disposal  COUNTS:  YES  TOURNIQUET:  * No tourniquets in log *  DICTATION: .Note written in EPIC  PLAN OF CARE: Admit to inpatient   PATIENT DISPOSITION:  PACU - hemodynamically stable.   Delay start of Pharmacological VTE agent (>24hrs) due to surgical blood loss or risk of bleeding: yes    Findings:  @BABYSEXEBC @ infant,  APGAR (1 MIN): 8   APGAR (5 MINS): 8   APGAR (10 MINS):   Normal uterus, tubes and ovaries, normal placenta. 3VC, clear amniotic fluid  EBL: Per nursing notes cc Antibiotics:   2g Ancef Complications: none  Indications: This is a 30 y.o. year-old, G2, P1 at [redacted]w[redacted]d admitted for repeat C-section. Risks benefits and alternatives of the procedure were discussed with the patient who agreed to proceed  Procedure:  After informed consent was obtained the patient was taken to the operating room where spinal anesthesia was initiated.  She was prepped and draped in the normal sterile fashion in dorsal supine position with a leftward tilt.  A foley catheter was in place.  A Pfannenstiel skin incision was made 2 cm above the pubic symphysis in the midline with the scalpel, over the old Pfannenstiel scar.  dissection was carried down with the Bovie cautery until the fascia was reached. The fascia was incised in the midline. The incision was extended laterally with the Mayo scissors. The  inferior aspect of the fascial incision was grasped with the Coker clamps, elevated up and the underlying rectus muscles were dissected off sharply. The superior aspect of the fascial incision was grasped with the Coker clamps elevated up and the underlying rectus muscles were dissected off sharply.  The peritoneum was entered bluntly. The peritoneal incision was extended superiorly and inferiorly with good visualization of the bladder. The bladder blade was inserted and palpation was done to assess the fetal position and the location of the uterine vessels.  Filmy bladder adhesions were taken down.  The lower segment of the uterus was incised sharply with the scalpel and extended  bluntly in the cephalo-caudal fashion. The infant was grasped, brought to the incision,  rotated and the infant was delivered with fundal pressure. The nose and mouth were bulb suctioned. The cord was clamped and cut after 1 minute delay. The infant was handed off to the waiting pediatrician. The placenta was expressed. The uterus was exteriorized. The uterus was cleared of all clots and debris. The uterine incision was repaired with 0 Vicryl in a running locked fashion.  A second layer of the same suture was used in an imbricating fashion to obtain excellent hemostasis.  The uterus was then returned to the abdomen, the gutters were cleared of all clots and debris. The uterine incision was reinspected and found to be hemostatic. The peritoneum was grasped and closed with 2-0 Vicryl in a running fashion. The cut muscle edges and the underside of the fascia were inspected and  found to be hemostatic. The fascia was closed with 0 Vicryl in a single layer. The subcutaneous tissue was irrigated. Scarpa's layer was closed with a 2-0 plain gut suture. The skin was closed with a 4-0 Monocryl in a single layer. The patient tolerated the procedure well. Sponge lap and needle counts were correct x3 and patient was taken to the recovery room in a  stable condition.  Lendon Colonel 07/29/2021 3:07 PM

## 2021-07-30 LAB — CBC
HCT: 29.8 % — ABNORMAL LOW (ref 36.0–46.0)
Hemoglobin: 10.1 g/dL — ABNORMAL LOW (ref 12.0–15.0)
MCH: 30.1 pg (ref 26.0–34.0)
MCHC: 33.9 g/dL (ref 30.0–36.0)
MCV: 88.7 fL (ref 80.0–100.0)
Platelets: 129 10*3/uL — ABNORMAL LOW (ref 150–400)
RBC: 3.36 MIL/uL — ABNORMAL LOW (ref 3.87–5.11)
RDW: 13.3 % (ref 11.5–15.5)
WBC: 15.8 10*3/uL — ABNORMAL HIGH (ref 4.0–10.5)
nRBC: 0 % (ref 0.0–0.2)

## 2021-07-30 LAB — BIRTH TISSUE RECOVERY COLLECTION (PLACENTA DONATION)

## 2021-07-30 NOTE — Progress Notes (Signed)
Subjective: POD# 1 Live born female  Birth Weight: 9 lb 10.9 oz (4390 g) APGAR: 8, 8  Newborn Delivery   Birth date/time: 07/29/2021 14:17:00 Delivery type: C-Section, Low Transverse Trial of labor: No C-section categorization: Repeat     Baby name: Hamilton Capri Delivering provider: Noland Fordyce   Feeding: breast  Pain control at delivery: Spinal   Reports feeling well this AM with some increased left sided incisional pain. Working with lactation on breastfeeding. Bleeding is minimal. Husband at the bedside.  Patient reports tolerating PO.   Pain controlled with prescription NSAID's including ketorolac (Toradol) Denies HA/SOB/C/P/N/V/dizziness. She reports vaginal bleeding as normal, without clots. She is ambulating and urinating without difficulty.     Objective: Vitals:   07/29/21 2030 07/30/21 0025 07/30/21 0430 07/30/21 0830  BP: 114/60 (!) 103/55 99/66 113/60  Pulse: 66 65 61 62  Resp: 18 18 18 18   Temp: 98 F (36.7 C) 98.7 F (37.1 C) 98.1 F (36.7 C) 98.2 F (36.8 C)  TempSrc: Oral Oral Oral Oral  SpO2: 98% 98% 99% 100%  Weight:      Height:         Intake/Output Summary (Last 24 hours) at 07/30/2021 0925 Last data filed at 07/30/2021 0430 Gross per 24 hour  Intake 1257.15 ml  Output 2260 ml  Net -1002.85 ml      Recent Labs    07/27/21 0936 07/30/21 0533  WBC 10.2 15.8*  HGB 13.1 10.1*  HCT 38.2 29.8*  PLT 153 129*    Blood type: --/--/O POS (08/30 0940)  Rubella:   Immune  Physical Exam:  General: alert and cooperative CV: Regular rate and rhythm Resp: clear Abdomen: soft, nontender, normal bowel sounds Incision: clean and pressure dressing intact Uterine Fundus: firm, below umbilicus, nontender Lochia: minimal Ext: extremities normal, atraumatic, no cyanosis or edema and no edema, redness or tenderness in the calves or thighs  Assessment/Plan: 30 y.o.   POD# 1. 26                  Principal Problem:   Postpartum care following  cesarean delivery 9/1  Encourage rest when baby rests Breastfeeding support Encourage to ambulate Routine post-op care Active Problems:   Generalized anxiety disorder  Continue Prozac 20mg   Close PP F/U for mood   Previous cesarean section   Cesarean delivery - repeat   S/P cesarean section  Anticipate discharge tomorrow.             11/1, CNM, MSN 07/30/2021, 9:25 AM

## 2021-07-30 NOTE — Lactation Note (Signed)
This note was copied from a baby's chart. Lactation Consultation Note  Patient Name: Dana Serrano [id number] Date: 07/30/2021 Reason for consult: Initial assessment;Term Age:30 hours   P2 mother whose infant is now 5 hours old.  This is a term baby at 39+6 weeks.  Mother breast fed and pumped for 10 months with her first child (now 75 years old).  Baby was swaddled and latched in the cradle hold when I arrived.  She was feeding with a shallow latch.  Offered suggestions to improve positioning and latch.  Mother made the adjustments easily and baby continued to feed with a wide gape and flanged lips.  Observed her feeding for 20 minutes while reviewing breast feeding basics.  After feeding mother placed her STS where she fell asleep.  Mother will feed 8-12 times/24 hours or sooner if baby shows feeding cues.  Encouraged to call her RN/LC for latch observation/assist as needed.    Mom made aware of O/P services, breastfeeding support groups, community resources, and our phone # for post-discharge questions.  Mother has a DEBP for home use.  Parents receptive to all teaching.   Maternal Data Has patient been taught Hand Expression?: Yes Does the patient have breastfeeding experience prior to this delivery?: Yes How long did the patient breastfeed?: Breast fed and pumped for 10 months  Feeding Mother's Current Feeding Choice: Breast Milk  LATCH Score Latch: Grasps breast easily, tongue down, lips flanged, rhythmical sucking.  Audible Swallowing: A few with stimulation  Type of Nipple: Everted at rest and after stimulation  Comfort (Breast/Nipple): Soft / non-tender  Hold (Positioning): Assistance needed to correctly position infant at breast and maintain latch.  LATCH Score: 8   Lactation Tools Discussed/Used    Interventions Interventions: Breast feeding basics reviewed;Assisted with latch;Skin to skin;Breast massage;Hand express;Breast compression;Adjust  position;Support pillows;Education;Position options  Discharge Pump: Personal WIC Program: No  Consult Status Consult Status: Follow-up Date: 07/31/21 Follow-up type: In-patient    [name] R [name] 07/30/2021, 10:55 AM

## 2021-07-30 NOTE — Social Work (Signed)
CSW received consult for hx of Anxiety and Postpartum Depression.  CSW met with [name] to offer support and complete assessment.     CSW introduced self and role. CSW observed infant 'Atlas' sleeping in bassinet. FOB arrived to room during assessment. [name] was welcoming of visit and declined to speak in private. CSW informed [name] of reason for consult. [name] reported she has dealt with anxiety her entire life. [name] shared she also experienced PPD following the birth of her son in 2020. [name] expressed she feels she was experiencing depressive symptoms prior to the birth in 2020. [name] stated she is currently on Prozac 48m, which is very helpful with managing symptoms.  [name] denies currently experiencing any symptoms of PPD, stating she feels excited and happy. [name] expressed she has a strong support system consisting of FOB and multiple members of both families. [name] disclosed she also has a postpartum follow-up appointment scheduled earlier than the typical 6 weeks. [name] denies any current SI or HI.   CSW provided education regarding the baby blues period versus PPD and provided resources. CSW provided the New Mom Checklist and encouraged [name] to self evaluate and contact a medical professional if symptoms are noted at any time.   CSW provided review of Sudden Infant Death Syndrome (SIDS) precautions. [name] stated she has a bedside bassinet and car seat for infant. [name] denies any transportation barriers to follow-up care. [name] expressed no additional needs at this time.     CSW identifies no further need for intervention and no barriers to discharge at this time.  CDarra Lis LBridgeportWork WEnterprise Productsand CMolson [employer]([phone]

## 2021-07-31 MED ORDER — OXYCODONE HCL 5 MG PO TABS: 5.0000 mg | ORAL_TABLET | ORAL | 0 refills | Status: DC | PRN

## 2021-07-31 MED ORDER — IBUPROFEN 600 MG PO TABS: 600.0000 mg | ORAL_TABLET | Freq: Four times a day (QID) | ORAL | 0 refills | Status: DC

## 2021-07-31 NOTE — Discharge Summary (Signed)
Postpartum Discharge Summary  Date of Service updated     Patient Name: Dana Serrano DOB: [birth year] MRN: [id number]  Date of admission: 07/29/2021 Delivery date:07/29/2021  Delivering provider: Aloha Gell  Date of discharge: 07/31/2021  Admitting diagnosis: Previous cesarean section [Z98.891] S/P cesarean section [Z98.891] Intrauterine pregnancy: [redacted]w[redacted]d    Secondary diagnosis:  Principal Problem:   Postpartum care following cesarean delivery 9/1 Active Problems:   Generalized anxiety disorder   Previous cesarean section   Cesarean delivery - repeat   S/P cesarean section  Additional problems: anemia    Discharge diagnosis: Term Pregnancy Delivered and Anemia                                              Post partum procedures: none Complications: None  Hospital course: Sceduled C/S   30y.o. yo [id number] admitted to the hospital 07/29/2021 for scheduled cesarean section with the following indication:Elective Repeat.Delivery details are as follows:  Membrane Rupture Time/Date: 2:16 PM ,07/29/2021   Delivery Method:C-Section, Low Transverse  Details of operation can be found in separate operative note.  Patient had an uncomplicated postpartum course.  She is ambulating, tolerating a regular diet, passing flatus, and urinating well. Patient is discharged home in stable condition on  07/31/21        Newborn Data: Birth date:07/29/2021  Birth time:2:17 PM  Gender:Female  Living status:Living  Apgars:8 ,8  Weight:4390 g     MMR:No T-DaP:Given postpartum Flu: N/A Transfusion:No  Physical exam  Vitals:   07/30/21 0830 07/30/21 1312 07/30/21 2305 07/31/21 0612  BP: 113/60 (!) 104/55 113/65 107/61  Pulse: 62 72 71 78  Resp: '18  18 18  ' Temp: 98.2 F (36.8 C) 98.5 F (36.9 C) 98.3 F (36.8 C) 98.2 F (36.8 C)  TempSrc: Oral  Oral Oral  SpO2: 100%  98% 100%  Weight:      Height:       General: alert, cooperative, and no distress Lochia:  appropriate Uterine Fundus: firm Incision: Healing well with no significant drainage DVT Evaluation: No evidence of DVT seen on physical exam. Labs: Lab Results  Component Value Date   WBC 15.8 (H) 07/30/2021   HGB 10.1 (L) 07/30/2021   HCT 29.8 (L) 07/30/2021   MCV 88.7 07/30/2021   PLT 129 (L) 07/30/2021   CMP Latest Ref Rng & Units 09/20/2019  Glucose 70 - 99 mg/dL 93  BUN 6 - 23 mg/dL 9  Creatinine 0.40 - 1.20 mg/dL 0.70  Sodium 135 - 145 mEq/L 140  Potassium 3.5 - 5.1 mEq/L 3.9  Chloride 96 - 112 mEq/L 103  CO2 19 - 32 mEq/L 29  Calcium 8.4 - 10.5 mg/dL 9.4  Total Protein 6.0 - 8.3 g/dL 6.8  Total Bilirubin 0.2 - 1.2 mg/dL 0.5  Alkaline Phos 39 - 117 U/L 89  AST 0 - 37 U/L 21  ALT 0 - 35 U/L 22   Edinburgh Score: Edinburgh Postnatal Depression Scale Screening Tool 05/21/2019  I have been able to laugh and see the funny side of things. 0  I have looked forward with enjoyment to things. 0  I have blamed myself unnecessarily when things went wrong. 1  I have been anxious or worried for no good reason. 2  I have felt scared or panicky for no good reason. 0  Things have been getting on top of me. 1  I have been so unhappy that I have had difficulty sleeping. 1  I have felt sad or miserable. 1  I have been so unhappy that I have been crying. 1  The thought of harming myself has occurred to me. 0  Edinburgh Postnatal Depression Scale Total 7      After visit meds:  Allergies as of 07/31/2021   No Known Allergies      Medication List     STOP taking these medications    DEPLIN 15 PO       TAKE these medications    ferrous sulfate 325 (65 FE) MG tablet Take 1 tablet (325 mg total) by mouth 2 (two) times daily with a meal. What changed: when to take this   FLUoxetine 20 MG tablet Commonly known as: PROZAC Take 20 mg by mouth daily. What changed: Another medication with the same name was removed. Continue taking this medication, and follow the directions  you see here.   ibuprofen 600 MG tablet Commonly known as: ADVIL Take 1 tablet (600 mg total) by mouth every 6 (six) hours.   Magnesium 400 MG Tabs Take 400 mg by mouth daily.   oxyCODONE 5 MG immediate release tablet Commonly known as: Oxy IR/ROXICODONE Take 1-2 tablets (5-10 mg total) by mouth every 4 (four) hours as needed for moderate pain.   Prenatal 19 tablet Chew 1 tablet by mouth daily.   Vitamin D 50 MCG (2000 UT) tablet Take 2,000 Units by mouth daily.               Discharge Care Instructions  (From admission, onward)           Start     Ordered   07/31/21 0000  Discharge wound care:       Comments: Keep clean and dry   07/31/21 1223             Discharge home in stable condition Infant Feeding: Breast Infant Disposition:home with mother Discharge instruction: per After Visit Summary and Postpartum booklet. Activity: Advance as tolerated. Pelvic rest for 6 weeks.  Diet: routine diet Anticipated Birth Control: Unsure Postpartum Appointment:2 weeks Additional Postpartum F/U: Postpartum Depression checkup Future Appointments:No future appointments. Follow up Visit:  Follow-up Information     Aloha Gell, MD Follow up.   Specialty: Obstetrics and Gynecology Contact information: [place] [address] [phone]                     07/31/2021 Charyl Bigger, MD

## 2021-07-31 NOTE — Lactation Note (Signed)
This note was copied from a baby's chart. Lactation Consultation Note  Patient Name: Dana Serrano Today's Date: 07/31/2021   Age:30 hours Mother declined visit on discharge teaching .  Maternal Data    Feeding    LATCH Score Latch: Grasps breast easily, tongue down, lips flanged, rhythmical sucking.  Audible Swallowing: A few with stimulation  Type of Nipple: Everted at rest and after stimulation  Comfort (Breast/Nipple): Filling, red/small blisters or bruises, mild/mod discomfort  Hold (Positioning): Assistance needed to correctly position infant at breast and maintain latch.  LATCH Score: 7   Lactation Tools Discussed/Used    Interventions    Discharge    Consult Status      Stevan Born [facility] 07/31/2021, 11:32 AM

## 2021-07-31 NOTE — Progress Notes (Addendum)
No c/o; pain controlled, tol po, ambulating, voiding w/o difficulty; +flatus No lightheadedness/dizziness; nml lochia    Patient Vitals for the past 24 hrs:  BP Temp Temp src Pulse Resp SpO2  07/31/21 0612 107/61 98.2 F (36.8 C) Oral 78 18 100 %  07/30/21 2305 113/65 98.3 F (36.8 C) Oral 71 18 98 %  07/30/21 1312 (!) 104/55 98.5 F (36.9 C) -- 72 -- --   A&ox3 Rrr Ctab Abd: soft, nt, nd; +bs; fundus firm and below umb; dressing: small area old blood, dry/intact LE: no edema, nt bilat  CBC Latest Ref Rng & Units 07/30/2021 07/27/2021 09/20/2019  WBC 4.0 - 10.5 K/uL 15.8(H) 10.2 8.1  Hemoglobin 12.0 - 15.0 g/dL 10.1(L) 13.1 14.4  Hematocrit 36.0 - 46.0 % 29.8(L) 38.2 42.1  Platelets 150 - 400 K/uL 129(L) 153 183.0   A/P: pod 2 s/p rltcs Doing well; plan d/c home today, f/u in 2 wks d/t h/o anxiety; plan pp visit in 6 wks Mild chronic anemia with acute change d/t blood loss - asymptomatic, iron daily Anxiety- prozac 20mg  daily; feels good, many life stressors after last delivery, monitor closely pp; f/u in 2 wks; h/o PP depression  4.  RI 5. RH positive

## 2021-08-03 ENCOUNTER — Other Ambulatory Visit: Payer: Self-pay

## 2021-08-03 ENCOUNTER — Emergency Department (HOSPITAL_COMMUNITY)

## 2021-08-03 ENCOUNTER — Encounter (HOSPITAL_COMMUNITY): Payer: Self-pay

## 2021-08-03 ENCOUNTER — Emergency Department (HOSPITAL_COMMUNITY)
Admission: EM | Admit: 2021-08-03 | Discharge: 2021-08-03 | Disposition: A | Discharge status: Home / Self Care | Attending: Emergency Medicine | Admitting: Emergency Medicine

## 2021-08-03 DIAGNOSIS — K5900 Constipation, unspecified: Secondary | ICD-10-CM | POA: Diagnosis present

## 2021-08-03 DIAGNOSIS — Z5321 Procedure and treatment not carried out due to patient leaving prior to being seen by health care provider: Secondary | ICD-10-CM | POA: Diagnosis not present

## 2021-08-03 NOTE — ED Triage Notes (Signed)
Pov from home with cc of constipation. C- section on Thursday. Last [facility] Wednesday. Tried an enema with no relief.

## 2021-08-03 NOTE — ED Notes (Signed)
Pt states she had BM and feels better now. Will follow up with PCP or come back if she feels worse.

## 2021-08-11 ENCOUNTER — Telehealth (HOSPITAL_COMMUNITY): Payer: Self-pay

## 2021-08-11 NOTE — Telephone Encounter (Signed)
   No answer. Left message to return nurse call.  Marcelino Duster [facility] 08/11/2021,1943

## 2022-12-01 LAB — OB RESULTS CONSOLE ANTIBODY SCREEN: Antibody Screen: NEGATIVE

## 2023-01-18 LAB — OB RESULTS CONSOLE RPR: RPR: NONREACTIVE

## 2023-01-18 LAB — OB RESULTS CONSOLE RUBELLA ANTIBODY, IGM: Rubella: IMMUNE

## 2023-01-18 LAB — OB RESULTS CONSOLE GC/CHLAMYDIA
Chlamydia: NEGATIVE
Neisseria Gonorrhea: NEGATIVE

## 2023-01-18 LAB — OB RESULTS CONSOLE HIV ANTIBODY (ROUTINE TESTING): HIV: NONREACTIVE

## 2023-01-18 LAB — HEPATITIS C ANTIBODY: HCV Ab: NEGATIVE

## 2023-01-18 LAB — OB RESULTS CONSOLE HEPATITIS B SURFACE ANTIGEN: Hepatitis B Surface Ag: NEGATIVE

## 2023-07-04 LAB — OB RESULTS CONSOLE GBS: GBS: POSITIVE

## 2023-07-12 ENCOUNTER — Other Ambulatory Visit: Payer: Self-pay | Admitting: Obstetrics

## 2023-07-19 ENCOUNTER — Encounter (HOSPITAL_COMMUNITY): Payer: Self-pay

## 2023-07-19 ENCOUNTER — Encounter (HOSPITAL_COMMUNITY): Payer: Self-pay | Admitting: *Deleted

## 2023-07-19 NOTE — Patient Instructions (Addendum)
Dana Serrano  07/19/2023   Your procedure is scheduled on:  08/02/2023  Arrive at 0830 at [employer] [facility] on [address] at [facility]  and [employer]. You are invited to use the FREE valet parking or use the Visitor's parking deck.  Pick up the phone at the desk and dial [phone].  Call this number if you have problems the morning of surgery: [phone]  Remember:   Do not eat food:(After Midnight) Desps de medianoche.  Do not drink clear liquids: (After Midnight) Desps de medianoche.  Take these medicines the morning of surgery with A SIP OF WATER:  Wellbutrin and fluoxetine   Do not wear jewelry, make-up or nail polish.  Do not wear lotions, powders, or perfumes. Do not wear deodorant.  Do not shave 48 hours prior to surgery.  Do not bring valuables to the hospital.  [facility] is not   responsible for any belongings or valuables brought to the hospital.  Contacts, dentures or bridgework may not be worn into surgery.  Leave suitcase in the car. After surgery it may be brought to your room.  For patients admitted to the hospital, checkout time is 11:00 AM the day of              discharge.      Please read over the following fact sheets that you were given:     Preparing for Surgery

## 2023-08-01 ENCOUNTER — Encounter (HOSPITAL_COMMUNITY)
Admission: RE | Admit: 2023-08-01 | Discharge: 2023-08-01 | Disposition: A | Discharge status: Home / Self Care | Source: Ambulatory Visit | Attending: Obstetrics and Gynecology | Admitting: Obstetrics and Gynecology

## 2023-08-01 DIAGNOSIS — Z01812 Encounter for preprocedural laboratory examination: Secondary | ICD-10-CM | POA: Insufficient documentation

## 2023-08-01 HISTORY — DX: Depression, unspecified: F32.A

## 2023-08-01 LAB — CBC
HCT: 35.9 % — ABNORMAL LOW (ref 36.0–46.0)
Hemoglobin: 11.5 g/dL — ABNORMAL LOW (ref 12.0–15.0)
MCH: 28.8 pg (ref 26.0–34.0)
MCHC: 32 g/dL (ref 30.0–36.0)
MCV: 90 fL (ref 80.0–100.0)
Platelets: 160 10*3/uL (ref 150–400)
RBC: 3.99 MIL/uL (ref 3.87–5.11)
RDW: 14 % (ref 11.5–15.5)
WBC: 9.4 10*3/uL (ref 4.0–10.5)
nRBC: 0 % (ref 0.0–0.2)

## 2023-08-01 LAB — TYPE AND SCREEN
ABO/RH(D): O POS
Antibody Screen: NEGATIVE

## 2023-08-01 LAB — RPR: RPR Ser Ql: NONREACTIVE

## 2023-08-01 NOTE — Anesthesia Preprocedure Evaluation (Addendum)
Anesthesia Evaluation  Patient identified by MRN, date of birth, ID band Patient awake    Reviewed: Allergy & Precautions, NPO status , Patient's Chart, lab work & pertinent test results  History of Anesthesia Complications Negative for: history of anesthetic complications  Airway Mallampati: I  TM Distance: >3 FB Neck ROM: Full    Dental  (+) Dental Advisory Given   Pulmonary neg pulmonary ROS   Pulmonary exam normal breath sounds clear to auscultation       Cardiovascular negative cardio ROS  Rhythm:Regular Rate:Normal     Neuro/Psych  PSYCHIATRIC DISORDERS Anxiety Depression    negative neurological ROS     GI/Hepatic negative GI ROS, Neg liver ROS,,,  Endo/Other  negative endocrine ROS    Renal/GU negative Renal ROS     Musculoskeletal   Abdominal  (+) + obese  Peds  Hematology  (+) Blood dyscrasia, anemia Lab Results      Component                Value               Date                      WBC                      9.4                 08/01/2023                HGB                      11.5 (L)            08/01/2023                HCT                      35.9 (L)            08/01/2023                MCV                      90.0                08/01/2023                PLT                      160                 08/01/2023              Anesthesia Other Findings S/p cesarean section x2  Reproductive/Obstetrics (+) Pregnancy                             Anesthesia Physical Anesthesia Plan  ASA: 2  Anesthesia Plan: Spinal   Post-op Pain Management: Regional block*   Induction:   PONV Risk Score and Plan: 2 and Ondansetron, Dexamethasone and Treatment may vary due to age or medical condition  Airway Management Planned: Natural Airway  Additional Equipment:   Intra-op Plan:   Post-operative Plan:   Informed Consent: I have reviewed the patients History and Physical,  chart, labs and discussed the procedure including the risks, benefits and  alternatives for the proposed anesthesia with the patient or authorized representative who has indicated his/her understanding and acceptance.       Plan Discussed with: Anesthesiologist and CRNA  Anesthesia Plan Comments: (I have discussed risks of neuraxial anesthesia including but not limited to infection, bleeding, nerve injury, back pain, headache, seizures, and failure of block. Patient denies bleeding disorders and is not currently anticoagulated. Labs have been reviewed. Risks and benefits discussed. All patient's questions answered.  )        Anesthesia Quick Evaluation

## 2023-08-02 ENCOUNTER — Inpatient Hospital Stay (HOSPITAL_COMMUNITY): Admitting: Anesthesiology

## 2023-08-02 ENCOUNTER — Encounter (HOSPITAL_COMMUNITY)
Admission: RE | Disposition: A | Discharge status: Home / Self Care | Payer: Self-pay | Source: Home / Self Care | Attending: Obstetrics and Gynecology

## 2023-08-02 ENCOUNTER — Inpatient Hospital Stay (HOSPITAL_COMMUNITY)
Admission: RE | Admit: 2023-08-02 | Discharge: 2023-08-04 | DRG: 785 | Disposition: A | Discharge status: Home / Self Care | Attending: Obstetrics and Gynecology | Admitting: Obstetrics and Gynecology

## 2023-08-02 ENCOUNTER — Other Ambulatory Visit: Payer: Self-pay

## 2023-08-02 ENCOUNTER — Encounter (HOSPITAL_COMMUNITY): Payer: Self-pay | Admitting: Obstetrics and Gynecology

## 2023-08-02 DIAGNOSIS — Z818 Family history of other mental and behavioral disorders: Secondary | ICD-10-CM

## 2023-08-02 DIAGNOSIS — Z302 Encounter for sterilization: Secondary | ICD-10-CM | POA: Diagnosis not present

## 2023-08-02 DIAGNOSIS — Z8249 Family history of ischemic heart disease and other diseases of the circulatory system: Secondary | ICD-10-CM

## 2023-08-02 DIAGNOSIS — Z3A39 39 weeks gestation of pregnancy: Secondary | ICD-10-CM

## 2023-08-02 DIAGNOSIS — Z825 Family history of asthma and other chronic lower respiratory diseases: Secondary | ICD-10-CM | POA: Diagnosis not present

## 2023-08-02 DIAGNOSIS — O99344 Other mental disorders complicating childbirth: Secondary | ICD-10-CM | POA: Diagnosis present

## 2023-08-02 DIAGNOSIS — O99824 Streptococcus B carrier state complicating childbirth: Secondary | ICD-10-CM | POA: Diagnosis present

## 2023-08-02 DIAGNOSIS — F411 Generalized anxiety disorder: Secondary | ICD-10-CM | POA: Diagnosis present

## 2023-08-02 DIAGNOSIS — O34219 Maternal care for unspecified type scar from previous cesarean delivery: Secondary | ICD-10-CM | POA: Diagnosis not present

## 2023-08-02 DIAGNOSIS — Z98891 History of uterine scar from previous surgery: Secondary | ICD-10-CM

## 2023-08-02 DIAGNOSIS — O34211 Maternal care for low transverse scar from previous cesarean delivery: Secondary | ICD-10-CM | POA: Diagnosis present

## 2023-08-02 DIAGNOSIS — Z833 Family history of diabetes mellitus: Secondary | ICD-10-CM | POA: Diagnosis not present

## 2023-08-02 HISTORY — PX: BILATERAL SALPINGECTOMY: SHX5743

## 2023-08-02 SURGERY — Surgical Case
Anesthesia: Spinal | Site: Abdomen | Laterality: Bilateral

## 2023-08-02 MED ORDER — TRANEXAMIC ACID-NACL 1000-0.7 MG/100ML-% IV SOLN
INTRAVENOUS | Status: DC | PRN
  Administered 2023-08-02: 1000 mg via INTRAVENOUS

## 2023-08-02 MED ORDER — ONDANSETRON HCL 4 MG/2ML IJ SOLN
INTRAMUSCULAR | Status: DC | PRN
  Administered 2023-08-02: 4 mg via INTRAVENOUS

## 2023-08-02 MED ORDER — CEFAZOLIN SODIUM-DEXTROSE 2-4 GM/100ML-% IV SOLN
INTRAVENOUS | Status: AC
  Filled 2023-08-02: qty 100

## 2023-08-02 MED ORDER — BUPIVACAINE HCL (PF) 0.25 % IJ SOLN
INTRAMUSCULAR | Status: AC
  Filled 2023-08-02: qty 10

## 2023-08-02 MED ORDER — LACTATED RINGERS IV SOLN: INTRAVENOUS | Status: DC

## 2023-08-02 MED ORDER — MORPHINE SULFATE (PF) 0.5 MG/ML IJ SOLN
INTRAMUSCULAR | Status: AC
  Filled 2023-08-02: qty 10

## 2023-08-02 MED ORDER — IBUPROFEN 600 MG PO TABS
600.0000 mg | ORAL_TABLET | Freq: Four times a day (QID) | ORAL | Status: DC
  Administered 2023-08-03 – 2023-08-04 (×4): 600 mg via ORAL
  Filled 2023-08-02 (×4): qty 1

## 2023-08-02 MED ORDER — DIBUCAINE (PERIANAL) 1 % EX OINT: 1.0000 | TOPICAL_OINTMENT | CUTANEOUS | Status: DC | PRN

## 2023-08-02 MED ORDER — OXYTOCIN-SODIUM CHLORIDE 30-0.9 UT/500ML-% IV SOLN: 2.5000 [IU]/h | INTRAVENOUS | Status: AC

## 2023-08-02 MED ORDER — PHENYLEPHRINE HCL-NACL 20-0.9 MG/250ML-% IV SOLN
INTRAVENOUS | Status: DC | PRN
  Administered 2023-08-02: 60 ug/min via INTRAVENOUS

## 2023-08-02 MED ORDER — BUPIVACAINE IN DEXTROSE 0.75-8.25 % IT SOLN
INTRATHECAL | Status: DC | PRN
  Administered 2023-08-02: 1.6 mL via INTRATHECAL

## 2023-08-02 MED ORDER — OXYCODONE HCL 5 MG PO TABS
5.0000 mg | ORAL_TABLET | ORAL | Status: DC | PRN
  Administered 2023-08-03: 5 mg via ORAL
  Administered 2023-08-04: 10 mg via ORAL
  Filled 2023-08-02: qty 1
  Filled 2023-08-02: qty 2

## 2023-08-02 MED ORDER — FENTANYL CITRATE (PF) 100 MCG/2ML IJ SOLN
INTRAMUSCULAR | Status: AC
  Filled 2023-08-02: qty 2

## 2023-08-02 MED ORDER — ONDANSETRON HCL 4 MG/2ML IJ SOLN
INTRAMUSCULAR | Status: AC
  Filled 2023-08-02: qty 2

## 2023-08-02 MED ORDER — KETOROLAC TROMETHAMINE 30 MG/ML IJ SOLN
30.0000 mg | Freq: Four times a day (QID) | INTRAMUSCULAR | Status: AC
  Administered 2023-08-02 – 2023-08-03 (×4): 30 mg via INTRAVENOUS
  Filled 2023-08-02 (×4): qty 1

## 2023-08-02 MED ORDER — DEXAMETHASONE SODIUM PHOSPHATE 10 MG/ML IJ SOLN
INTRAMUSCULAR | Status: DC | PRN
Start: 2023-08-02 — End: 2023-08-02
  Administered 2023-08-02: 10 mg via INTRAVENOUS

## 2023-08-02 MED ORDER — PRENATAL MULTIVITAMIN CH
1.0000 | ORAL_TABLET | Freq: Every day | ORAL | Status: DC
  Administered 2023-08-03 – 2023-08-04 (×2): 1 via ORAL
  Filled 2023-08-02 (×2): qty 1

## 2023-08-02 MED ORDER — SIMETHICONE 80 MG PO CHEW: 80.0000 mg | CHEWABLE_TABLET | ORAL | Status: DC | PRN

## 2023-08-02 MED ORDER — BUPROPION HCL ER (XL) 150 MG PO TB24
150.0000 mg | ORAL_TABLET | Freq: Every day | ORAL | Status: DC
  Administered 2023-08-03 – 2023-08-04 (×2): 150 mg via ORAL
  Filled 2023-08-02 (×2): qty 1

## 2023-08-02 MED ORDER — MORPHINE SULFATE (PF) 0.5 MG/ML IJ SOLN
INTRAMUSCULAR | Status: DC | PRN
  Administered 2023-08-02: 150 ug via INTRATHECAL

## 2023-08-02 MED ORDER — BUPIVACAINE LIPOSOME 1.3 % IJ SUSP
INTRAMUSCULAR | Status: AC
  Filled 2023-08-02: qty 20

## 2023-08-02 MED ORDER — OXYTOCIN-SODIUM CHLORIDE 30-0.9 UT/500ML-% IV SOLN
INTRAVENOUS | Status: DC | PRN
  Administered 2023-08-02: 200 mL via INTRAVENOUS

## 2023-08-02 MED ORDER — SODIUM CHLORIDE 0.9 % IR SOLN
Status: DC | PRN
  Administered 2023-08-02: 1000 mL

## 2023-08-02 MED ORDER — OXYTOCIN-SODIUM CHLORIDE 30-0.9 UT/500ML-% IV SOLN
INTRAVENOUS | Status: AC
  Filled 2023-08-02: qty 500

## 2023-08-02 MED ORDER — FLUOXETINE HCL 20 MG PO CAPS
20.0000 mg | ORAL_CAPSULE | Freq: Every day | ORAL | Status: DC
  Administered 2023-08-03 – 2023-08-04 (×2): 20 mg via ORAL
  Filled 2023-08-02 (×3): qty 1

## 2023-08-02 MED ORDER — SIMETHICONE 80 MG PO CHEW
80.0000 mg | CHEWABLE_TABLET | Freq: Three times a day (TID) | ORAL | Status: DC
  Administered 2023-08-02 – 2023-08-04 (×5): 80 mg via ORAL
  Filled 2023-08-02 (×6): qty 1

## 2023-08-02 MED ORDER — BUPIVACAINE LIPOSOME 1.3 % IJ SUSP
INTRAMUSCULAR | Status: DC | PRN
  Administered 2023-08-02: 20 mL

## 2023-08-02 MED ORDER — CEFAZOLIN SODIUM-DEXTROSE 2-4 GM/100ML-% IV SOLN
2.0000 g | INTRAVENOUS | Status: AC
  Administered 2023-08-02: 2 g via INTRAVENOUS

## 2023-08-02 MED ORDER — POVIDONE-IODINE 10 % EX SWAB
2.0000 | Freq: Once | CUTANEOUS | Status: AC
  Administered 2023-08-02: 2 via TOPICAL

## 2023-08-02 MED ORDER — WITCH HAZEL-GLYCERIN EX PADS: 1.0000 | MEDICATED_PAD | CUTANEOUS | Status: DC | PRN

## 2023-08-02 MED ORDER — METHYLERGONOVINE MALEATE 0.2 MG PO TABS: 0.2000 mg | ORAL_TABLET | ORAL | Status: DC | PRN

## 2023-08-02 MED ORDER — ACETAMINOPHEN 10 MG/ML IV SOLN
INTRAVENOUS | Status: DC | PRN
  Administered 2023-08-02: 1000 mg via INTRAVENOUS

## 2023-08-02 MED ORDER — METHYLERGONOVINE MALEATE 0.2 MG/ML IJ SOLN: 0.2000 mg | INTRAMUSCULAR | Status: DC | PRN

## 2023-08-02 MED ORDER — MENTHOL 3 MG MT LOZG: 1.0000 | LOZENGE | OROMUCOSAL | Status: DC | PRN

## 2023-08-02 MED ORDER — BUPIVACAINE HCL (PF) 0.25 % IJ SOLN
INTRAMUSCULAR | Status: DC | PRN
  Administered 2023-08-02: 10 mL

## 2023-08-02 MED ORDER — ZOLPIDEM TARTRATE 5 MG PO TABS: 5.0000 mg | ORAL_TABLET | Freq: Every evening | ORAL | Status: DC | PRN

## 2023-08-02 MED ORDER — STERILE WATER FOR IRRIGATION IR SOLN
Status: DC | PRN
  Administered 2023-08-02: 1000 mL

## 2023-08-02 MED ORDER — PHENYLEPHRINE HCL-NACL 20-0.9 MG/250ML-% IV SOLN
INTRAVENOUS | Status: AC
  Filled 2023-08-02: qty 250

## 2023-08-02 MED ORDER — DIPHENHYDRAMINE HCL 25 MG PO CAPS: 25.0000 mg | ORAL_CAPSULE | Freq: Four times a day (QID) | ORAL | Status: DC | PRN

## 2023-08-02 MED ORDER — FENTANYL CITRATE (PF) 100 MCG/2ML IJ SOLN
INTRAMUSCULAR | Status: DC | PRN
  Administered 2023-08-02: 15 ug via INTRATHECAL

## 2023-08-02 MED ORDER — ACETAMINOPHEN 500 MG PO TABS
1000.0000 mg | ORAL_TABLET | Freq: Four times a day (QID) | ORAL | Status: DC
  Administered 2023-08-02 – 2023-08-04 (×8): 1000 mg via ORAL
  Filled 2023-08-02 (×8): qty 2

## 2023-08-02 MED ORDER — DEXAMETHASONE SODIUM PHOSPHATE 10 MG/ML IJ SOLN
INTRAMUSCULAR | Status: AC
  Filled 2023-08-02: qty 2

## 2023-08-02 MED ORDER — COCONUT OIL OIL: 1.0000 | TOPICAL_OIL | Status: DC | PRN

## 2023-08-02 MED ORDER — SENNOSIDES-DOCUSATE SODIUM 8.6-50 MG PO TABS
2.0000 | ORAL_TABLET | Freq: Every day | ORAL | Status: DC
  Administered 2023-08-03 – 2023-08-04 (×2): 2 via ORAL
  Filled 2023-08-02 (×2): qty 2

## 2023-08-02 SURGICAL SUPPLY — 38 items
APL PRP STRL LF DISP 70% ISPRP (MISCELLANEOUS) ×4
APL SKNCLS STERI-STRIP NONHPOA (GAUZE/BANDAGES/DRESSINGS) ×2
BENZOIN TINCTURE PRP APPL 2/3 (GAUZE/BANDAGES/DRESSINGS) IMPLANT
CHLORAPREP W/TINT 26 (MISCELLANEOUS) ×4 IMPLANT
CLAMP UMBILICAL CORD (MISCELLANEOUS) ×2 IMPLANT
CLOTH BEACON ORANGE TIMEOUT ST (SAFETY) ×2 IMPLANT
DRSG OPSITE POSTOP 4X10 (GAUZE/BANDAGES/DRESSINGS) ×2 IMPLANT
ELECT REM PT RETURN 9FT ADLT (ELECTROSURGICAL) ×2
ELECTRODE REM PT RTRN 9FT ADLT (ELECTROSURGICAL) ×2 IMPLANT
EXTRACTOR VACUUM M CUP 4 TUBE (SUCTIONS) IMPLANT
GAUZE PAD ABD 8X10 STRL (GAUZE/BANDAGES/DRESSINGS) IMPLANT
GAUZE SPONGE 4X4 12PLY STRL (GAUZE/BANDAGES/DRESSINGS) IMPLANT
GLOVE BIO SURGEON STRL SZ7.5 (GLOVE) ×2 IMPLANT
GLOVE BIOGEL PI IND STRL 7.0 (GLOVE) ×2 IMPLANT
GOWN STRL REUS W/TWL LRG LVL3 (GOWN DISPOSABLE) ×4 IMPLANT
KIT ABG SYR 3ML LUER SLIP (SYRINGE) IMPLANT
NDL HYPO 25X5/8 SAFETYGLIDE (NEEDLE) IMPLANT
NDL SPNL 20GX3.5 QUINCKE YW (NEEDLE) IMPLANT
NEEDLE HYPO 22GX1.5 SAFETY (NEEDLE) ×2 IMPLANT
NEEDLE HYPO 25X5/8 SAFETYGLIDE (NEEDLE) IMPLANT
NEEDLE SPNL 20GX3.5 QUINCKE YW (NEEDLE) IMPLANT
NS IRRIG 1000ML POUR BTL (IV SOLUTION) ×2 IMPLANT
PACK C SECTION WH (CUSTOM PROCEDURE TRAY) ×2 IMPLANT
PAD OB MATERNITY 4.3X12.25 (PERSONAL CARE ITEMS) ×2 IMPLANT
STRIP CLOSURE SKIN 1/2X4 (GAUZE/BANDAGES/DRESSINGS) IMPLANT
SUT MNCRL 0 VIOLET CTX 36 (SUTURE) ×4 IMPLANT
SUT MNCRL AB 3-0 PS2 27 (SUTURE) IMPLANT
SUT MON AB 2-0 CT1 27 (SUTURE) ×2 IMPLANT
SUT MON AB-0 CT1 36 (SUTURE) ×4 IMPLANT
SUT PLAIN 0 NONE (SUTURE) IMPLANT
SUT PLAIN 2 0 (SUTURE)
SUT PLAIN 2 0 XLH (SUTURE) IMPLANT
SUT PLAIN ABS 2-0 CT1 27XMFL (SUTURE) IMPLANT
SYR 20CC LL (SYRINGE) IMPLANT
TAPE PAPER 1X10 WHT MICROPORE (GAUZE/BANDAGES/DRESSINGS) IMPLANT
TOWEL OR 17X24 6PK STRL BLUE (TOWEL DISPOSABLE) ×2 IMPLANT
TRAY FOLEY W/BAG SLVR 14FR LF (SET/KITS/TRAYS/PACK) ×2 IMPLANT
WATER STERILE IRR 1000ML POUR (IV SOLUTION) ×2 IMPLANT

## 2023-08-02 NOTE — Lactation Note (Signed)
This note was copied from a baby's chart. Lactation Consultation Note  Patient Name: Boy Tamarie Waxler [id number] Date: 08/02/2023 Age:32 hours  Reason for consult: Initial assessment;Term  P3, [redacted]w[redacted]d  Initial lactation visit to see P3 [name] of term infant. Mother has successfully breast fed her other 2 children for 9 months. She reports her first child had a "lip tie" but he did not require any intervention to be able to breastfeed. She reports baby Boy " Juanetta Gosling" has fed "pretty good so far". Due to mother's experience, she does not feel she needs LC services and will contact LC, if needed.  Mom made aware of O/P services, breastfeeding support groups, and our phone # for post-discharge questions.     Maternal Data Does the patient have breastfeeding experience prior to this delivery?: Yes How long did the patient breastfeed?: 9 months x 2 children  Feeding Mother's Current Feeding Choice: Breast Milk   Interventions  Education   Consult Status Consult Status: PRN    Omar Person 08/02/2023, 2:54 PM

## 2023-08-02 NOTE — Op Note (Signed)
Cesarean Section Procedure Note  Indications: previous uterine incision kerr x2 and elective sterilization  Pre-operative Diagnosis: 39 week 4 day pregnancy.  Post-operative Diagnosis: same  Surgeon: Lenoard Aden   Assistants: Renae Fickle, CNM  Anesthesia: Local anesthesia 0.25.% bupivacaine and Spinal anesthesia  ASA Class: 2  Procedure Details  The patient was seen in the Holding Room. The risks, benefits, complications, treatment options, and expected outcomes were discussed with the patient.  The patient concurred with the proposed plan, giving informed consent. The risks of anesthesia, infection, bleeding and possible injury to other organs discussed. Injury to bowel, bladder, or ureter with possible need for repair discussed. Possible need for transfusion with secondary risks of hepatitis or HIV acquisition discussed. Post operative complications to include but not limited to DVT, PE and Pneumonia noted. The site of surgery properly noted/marked. The patient was taken to Operating Room # B, identified as Dana Serrano and the procedure verified as C-Section Delivery. A Time Out was held and the above information confirmed.  After induction of anesthesia, the patient was draped and prepped in the usual sterile manner. A Pfannenstiel incision was made and carried down through the subcutaneous tissue to the fascia. Fascial incision was made and extended transversely using Mayo scissors. The fascia was separated from the underlying rectus tissue superiorly and inferiorly. The peritoneum was identified and entered. Peritoneal incision was extended longitudinally. The utero-vesical peritoneal reflection was incised transversely and the bladder flap was bluntly freed from the lower uterine segment. A low transverse uterine incision(Kerr hysterotomy) was made. Delivered from OA presentation was a  female with Apgar scores of 9 at one minute and 9 at five minutes. Bulb suctioning gently performed.  Neonatal team in attendance.After the umbilical cord was clamped and cut cord blood was obtained for evaluation. The placenta was removed intact and appeared normal. The uterus was curetted with a dry lap pack. Good hemostasis was noted.The uterine outline, tubes and ovaries appeared normal. The uterine incision was closed with running locked sutures of 0 Monocryl x 1 layers. Hemostasis was observed. Bilateral total salpingectomy with Ligasure device in standard fashion. Good hemostasis observed.The fascia was then reapproximated with running sutures of 0 Monocryl. The skin was reapproximated with 3-0 monocryl after [place] closure with 2-0 plain  Instrument, sponge, and needle counts were correct prior the abdominal closure and at the conclusion of the case.   Findings: FTLM, OT. Post placenta. Nl ovaries and tubes.  Estimated Blood Loss:  300 mL         Drains: foley                 Specimens: placenta and bilateral tubes.                 Complications:  None; patient tolerated the procedure well.         Disposition: PACU - hemodynamically stable.         Condition: stable  Attending Attestation: I performed the procedure.

## 2023-08-02 NOTE — Transfer of Care (Signed)
Immediate Anesthesia Transfer of Care Note  Patient: Dana Serrano  Procedure(s) Performed: Repeat CESAREAN SECTION WITH BILATERAL TUBAL LIGATION by Salpingectomy (Bilateral: Abdomen) OPEN BILATERAL SALPINGECTOMY  Patient Location: PACU  Anesthesia Type:Spinal  Level of Consciousness: awake, alert , and oriented  Airway & Oxygen Therapy: Patient Spontanous Breathing  Post-op Assessment: Report given to RN and Post -op Vital signs reviewed and stable  Post vital signs: Reviewed and stable  Last Vitals:  Vitals Value Taken Time  BP 98/55 08/02/23 1133  Temp    Pulse 69 08/02/23 1137  Resp 18 08/02/23 1137  SpO2 99 % 08/02/23 1137  Vitals shown include unfiled device data.  Last Pain:  Vitals:   08/02/23 0913  TempSrc: Oral      Patients Stated Pain Goal: 4 (08/02/23 0913)  Complications: No notable events documented.

## 2023-08-02 NOTE — Anesthesia Postprocedure Evaluation (Signed)
Anesthesia Post Note  Patient: Dana Serrano  Procedure(s) Performed: Repeat CESAREAN SECTION WITH BILATERAL TUBAL LIGATION by Salpingectomy (Bilateral: Abdomen) OPEN BILATERAL SALPINGECTOMY     Patient location during evaluation: PACU Anesthesia Type: Spinal Level of consciousness: awake Pain management: pain level controlled Vital Signs Assessment: post-procedure vital signs reviewed and stable Respiratory status: spontaneous breathing, respiratory function stable and nonlabored ventilation Cardiovascular status: blood pressure returned to baseline and stable Postop Assessment: no headache, no backache and no apparent nausea or vomiting Anesthetic complications: no   No notable events documented.  Last Vitals:  Vitals:   08/02/23 1239 08/02/23 1430  BP: 103/66 113/64  Pulse: 66 69  Resp: 16 18  Temp: 36.6 C 36.7 C  SpO2: 100% 99%    Last Pain:  Vitals:   08/02/23 1430  TempSrc: Oral  PainSc:                  [name]

## 2023-08-02 NOTE — H&P (Signed)
Dana Serrano is a 32 y.o. female presenting for rpt csection and TL. 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 only   IBS (irritable bowel syndrome)    Past Surgical History:  Procedure Laterality Date   BREAST ENHANCEMENT SURGERY     2013   CESAREAN SECTION N/A 05/20/2019   Procedure: CESAREAN SECTION;  Surgeon: Olivia Mackie, MD;  Location: [facility] LD ORS;  Service: Obstetrics;  Laterality: N/A;   CESAREAN SECTION N/A 07/29/2021   Procedure: Repeat CESAREAN SECTION;  Surgeon: Noland Fordyce, MD;  Location: [facility] LD ORS;  Service: Obstetrics;  Laterality: N/A;  EDD: 07/30/21   TONSILLECTOMY     2009   WISDOM TOOTH EXTRACTION     08.2015   Family History: family history includes Alcohol abuse in her father and mother; Alcoholism in her father and mother; Bipolar disorder in her maternal aunt; Breast cancer in her paternal grandmother; COPD in her paternal grandfather; Cancer in her maternal grandmother and paternal grandmother; Depression in her paternal grandmother; Diabetes in her paternal grandfather; Drug abuse in her paternal uncle; Early death in her paternal uncle; Healthy in her brother; Heart attack in her paternal grandfather; Heart disease in her paternal grandfather; Hyperlipidemia in her father and paternal grandfather; Hypertension in her father and paternal grandfather; Obesity in her father; Renal Disease in her maternal grandfather; Skin cancer in her maternal grandmother. Social History:  reports that she has never smoked. She has never used smokeless tobacco. She reports current alcohol use. She reports that she does not use drugs.     Maternal Diabetes:  No Genetic Screening: Normal Maternal Ultrasounds/Referrals: Normal Fetal Ultrasounds or other Referrals:  None Maternal Substance Abuse:  No Significant Maternal Medications:  None Significant Maternal Lab Results:  Group B Strep positive Number of Prenatal Visits:greater than 3 verified prenatal visits Other Comments:  None  Review of Systems  Constitutional: Negative.   All other systems reviewed and are negative.  Maternal Medical History:  Reason for admission: Contractions.   Contractions: Frequency: rare.   Perceived severity is mild.   Fetal activity: Perceived fetal activity is normal.   Last perceived fetal movement was within the past hour.   Prenatal Complications - Diabetes: none.     unknown if currently breastfeeding. Maternal Exam:  Uterine Assessment: Contraction strength is mild.  Contraction frequency is rare.  Abdomen: Surgical scars: low transverse.   Fetal presentation: vertex Introitus: Normal vulva. Normal vagina.  Ferning test: not done.  Nitrazine test: not done. Amniotic fluid character: not assessed. Pelvis: questionable for delivery.   Cervix: Cervix evaluated by digital exam.     Physical Exam Constitutional:      Appearance: Normal appearance. She is normal weight.  HENT:     Head: Normocephalic and atraumatic.  Cardiovascular:     Rate and Rhythm: Normal rate and  regular rhythm.     Pulses: Normal pulses.     Heart sounds: Normal heart sounds.  Pulmonary:     Effort: Pulmonary effort is normal.     Breath sounds: Normal breath sounds.  Abdominal:     General: Bowel sounds are normal.     Palpations: Abdomen is soft.  Genitourinary:    General: Normal vulva.  Musculoskeletal:        General: Normal range of motion.     Cervical back: Neck supple.  Skin:    General: Skin is warm and dry.  Neurological:     General: No focal deficit present.     Mental Status: She is alert and oriented to person, place, and time.  Psychiatric:         Mood and Affect: Mood normal.        Behavior: Behavior normal.     Prenatal labs: ABO, Rh: --/--/O POS (09/03 7829) Antibody: NEG (09/03 0939) Rubella: Immune (02/21 0000) RPR: NON REACTIVE (09/03 0933)  HBsAg: Negative (02/21 0000)  HIV: Non-reactive (02/21 0000)  GBS: Positive/-- (08/06 0000)   Assessment/Plan: 39+ wk IUP Previous csection x 2 Rpt csection and TL. Total vs partial salpingectomy discussed. Surgical risks to include risks of infection, bleeding , injury to surrounding organs with need for repair discussed. Failure risks of TL noted.  Consent done.   Tanna Loeffler J 08/02/2023, 6:59 AM

## 2023-08-02 NOTE — Anesthesia Procedure Notes (Signed)
Spinal  Patient location during procedure: OR Start time: 08/02/2023 10:18 AM End time: 08/02/2023 10:20 AM Reason for block: surgical anesthesia Staffing Performed: anesthesiologist  Anesthesiologist: [name], MD Performed by: [name], MD Authorized by: [name], MD   Preanesthetic Checklist Completed: patient identified, IV checked, site marked, risks and benefits discussed, surgical consent, monitors and equipment checked, pre-op evaluation and timeout performed Spinal Block Patient position: sitting Prep: DuraPrep Patient monitoring: blood pressure and continuous pulse ox Approach: midline Location: L3-4 Injection technique: single-shot Needle Needle type: Pencan  Needle gauge: 24 G Needle length: 9 cm Assessment Sensory level: T4 Additional Notes Risks and benefits of neuraxial anesthesia including, but not limited to, infection, bleeding, local anesthetic toxicity, headache, hypotension, back pain, block failure, etc. were discussed with the patient. The patient expressed understanding and consented to the procedure. I confirmed that the patient has no bleeding disorders and is not taking blood thinners. I confirmed the patient's last platelet count with the nurse. Monitors were applied. A time-out was performed immediately prior to the procedure. Sterile technique was used throughout the whole procedure.   1 attempt(s)

## 2023-08-03 ENCOUNTER — Encounter (HOSPITAL_COMMUNITY): Payer: Self-pay | Admitting: Obstetrics and Gynecology

## 2023-08-03 LAB — CBC
HCT: 31.3 % — ABNORMAL LOW (ref 36.0–46.0)
Hemoglobin: 10.3 g/dL — ABNORMAL LOW (ref 12.0–15.0)
MCH: 29.2 pg (ref 26.0–34.0)
MCHC: 32.9 g/dL (ref 30.0–36.0)
MCV: 88.7 fL (ref 80.0–100.0)
Platelets: 143 10*3/uL — ABNORMAL LOW (ref 150–400)
RBC: 3.53 MIL/uL — ABNORMAL LOW (ref 3.87–5.11)
RDW: 13.6 % (ref 11.5–15.5)
WBC: 16.7 10*3/uL — ABNORMAL HIGH (ref 4.0–10.5)
nRBC: 0 % (ref 0.0–0.2)

## 2023-08-03 NOTE — Progress Notes (Signed)
[name] was referred for history of depression/anxiety.  * Referral screened out by Clinical Social Worker because none of the following criteria appear to apply:  ~ History of anxiety/depression during this pregnancy, or of post-partum depression following prior delivery.  ~ Diagnosis of anxiety and/or depression within last 3 years  OR  * [name]'s symptoms currently being treated with medication and/or therapy.  Per OB notes, [name] is currently prescribed Wellbutrin 150mg  and Prozac 20mg  for support of symptoms.  Please contact the Clinical Social Worker if needs arise, by [facility] request, or if [name] scores greater than 9/yes to question 10 on Edinburgh Postpartum Depression Screen.  Enos Fling, Theresia Majors Clinical Social Worker [phone]

## 2023-08-03 NOTE — Progress Notes (Signed)
      Subjective: POD# 1 Live born female  Birth Weight: 9 lb 8.4 oz (4320 g) APGAR: 8, 8  Newborn Delivery   Birth date/time: 08/02/2023 10:41:00 Delivery type: C-Section, Low Transverse Trial of labor: No C-section categorization: Repeat    Baby name: Juanetta Gosling Delivering provider: Olivia Mackie  circumcision completed Feeding: breast  Pain control at delivery: Spinal  Reports feeling well.  Patient reports tolerating PO.   Breast symptoms:none Pain controlled with  PO meds Denies HA/SOB/C/P/N/V/dizziness. Flatus present. She reports vaginal bleeding as normal, without clots.  She is ambulating, urinating without difficulty.     Objective:   VS:    Vitals:   08/02/23 1430 08/02/23 1740 08/02/23 2112 08/03/23 0122  BP: 113/64  (!) 113/58 97/60  Pulse: 69  73 71  Resp: 18 18 17 17   Temp: 98.1 F (36.7 C) 98.8 F (37.1 C) 99 F (37.2 C) 98.6 F (37 C)  TempSrc: Oral Oral Oral Oral  SpO2: 99% 98% 99% 98%  Weight:      Height:         Intake/Output Summary (Last 24 hours) at 08/03/2023 0934 Last data filed at 08/03/2023 0820 Gross per 24 hour  Intake 1000 ml  Output 3213 ml  Net -2213 ml        Recent Labs    08/01/23 0933 08/03/23 0454  WBC 9.4 16.7*  HGB 11.5* 10.3*  HCT 35.9* 31.3*  PLT 160 143*     Blood type: --/--/O POS (09/03 4098)  Rubella: Immune (02/21 0000)    Physical Exam:  General: alert, cooperative, and no distress CV: Regular rate and rhythm Resp: clear Abdomen: soft, nontender, normal bowel sounds Incision: clean, dry, intact, and pressure dressing in place Uterine Fundus: firm, below umbilicus, nontender Lochia: minimal Ext: no edema, redness or tenderness in the calves or thighs  Assessment/Plan: 32 y.o.   POD# 1. [id number]                  Principal Problem:   Postpartum care following cesarean delivery and BTS 9/4 Active Problems:   Generalized anxiety disorder  - stable on Wellbutrin XL 150   Previous cesarean  section   Doing well, stable.               Advance diet as tolerated Encourage rest when baby rests Breastfeeding support Encourage to ambulate Routine post-op care  Neta Mends, CNM, MSN 08/03/2023, 9:34 AM

## 2023-08-04 LAB — SURGICAL PATHOLOGY

## 2023-08-04 MED ORDER — IBUPROFEN 600 MG PO TABS: 600.0000 mg | ORAL_TABLET | Freq: Four times a day (QID) | ORAL | 0 refills | Status: AC

## 2023-08-04 MED ORDER — ACETAMINOPHEN 500 MG PO TABS: 1000.0000 mg | ORAL_TABLET | Freq: Four times a day (QID) | ORAL | 0 refills | Status: AC

## 2023-08-04 MED ORDER — OXYCODONE HCL 5 MG PO TABS: 5.0000 mg | ORAL_TABLET | ORAL | 0 refills | Status: AC | PRN

## 2023-08-04 NOTE — Discharge Summary (Signed)
Postpartum Discharge Summary     Patient Name: Dana Serrano DOB: 01-05-1991 MRN: [id number]  Date of admission: 08/02/2023 Delivery date:08/02/2023 Delivering provider: Olivia Mackie Date of discharge: 08/04/2023  Admitting diagnosis: Previous cesarean section [Z98.891] Previous cesarean delivery affecting pregnancy [O34.219] Intrauterine pregnancy: [redacted]w[redacted]d     Secondary diagnosis:  Principal Problem:   Postpartum care following cesarean delivery and BTS 9/4 Active Problems:   Generalized anxiety disorder   Previous cesarean section  Additional problems: none    Discharge diagnosis: Term Pregnancy Delivered by schedule repeat c-section                                              Post partum procedures: None Augmentation: N/A Complications: None  Hospital course: Sceduled C/S   32 y.o. yo G3P3003 at [redacted]w[redacted]d was admitted to the hospital 08/02/2023 for scheduled cesarean section with the following indication:Elective Repeat.Delivery details are as follows:  Membrane Rupture Time/Date:  ,   Delivery Method:C-Section, Low Transverse Operative Delivery:N/A Details of operation can be found in separate operative note.  Patient had a postpartum course complicated by none.  She is ambulating, tolerating a regular diet, passing flatus, and urinating well. Patient is discharged home in stable condition on  08/04/23        Newborn Data: Birth date:08/02/2023 Birth time:10:41 AM Gender:Female Living status:Living Apgars:8 ,8  Weight:4320 g    Magnesium Sulfate received: No BMZ received: No Rhophylac:N/A MMR:N/A T-DaP:Given prenatally Flu: N/A Transfusion:No  Physical exam  Vitals:   08/03/23 0122 08/03/23 1427 08/03/23 1940 08/04/23 0536  BP: 97/60 108/67 112/60 (!) 107/54  Pulse: 71 69 72 83  Resp: 17 18 17 16   Temp: 98.6 F (37 C) 99 F (37.2 C) 98.2 F (36.8 C) 97.9 F (36.6 C)  TempSrc: Oral Oral Oral Oral  SpO2: 98%  99% 100%  Weight:      Height:       General:  alert, cooperative, and no distress Lochia: appropriate Uterine Fundus: firm Incision: Healing well with no significant drainage, No significant erythema DVT Evaluation: Negative Homan's sign. Labs: Lab Results  Component Value Date   WBC 16.7 (H) 08/03/2023   HGB 10.3 (L) 08/03/2023   HCT 31.3 (L) 08/03/2023   MCV 88.7 08/03/2023   PLT 143 (L) 08/03/2023      Latest Ref Rng & Units 09/20/2019    1:32 PM  CMP  Glucose 70 - 99 mg/dL 93   BUN 6 - 23 mg/dL 9   Creatinine 7.82 - 9.56 mg/dL 2.13   Sodium 086 - 578 mEq/L 140   Potassium 3.5 - 5.1 mEq/L 3.9   Chloride 96 - 112 mEq/L 103   CO2 19 - 32 mEq/L 29   Calcium 8.4 - 10.5 mg/dL 9.4   Total Protein 6.0 - 8.3 g/dL 6.8   Total Bilirubin 0.2 - 1.2 mg/dL 0.5   Alkaline Phos 39 - 117 U/L 89   AST 0 - 37 U/L 21   ALT 0 - 35 U/L 22    Edinburgh Score:    08/02/2023    1:07 PM  Edinburgh Postnatal Depression Scale Screening Tool  I have been able to laugh and see the funny side of things. 0  I have looked forward with enjoyment to things. 1  I have blamed myself unnecessarily when things went wrong. 0  I have been anxious or worried for no good reason. 2  I have felt scared or panicky for no good reason. 2  Things have been getting on top of me. 1  I have been so unhappy that I have had difficulty sleeping. 0  I have felt sad or miserable. 0  I have been so unhappy that I have been crying. 0  The thought of harming myself has occurred to me. 0  Edinburgh Postnatal Depression Scale Total 6      After visit meds:  Allergies as of 08/04/2023   No Known Allergies      Medication List     TAKE these medications    acetaminophen 500 MG tablet Commonly known as: TYLENOL Take 2 tablets (1,000 mg total) by mouth every 6 (six) hours.   buPROPion 150 MG 24 hr tablet Commonly known as: WELLBUTRIN XL Take 1 tablet by mouth daily.   FLUoxetine 20 MG tablet Commonly known as: PROZAC Take 20 mg by mouth daily.    ibuprofen 600 MG tablet Commonly known as: ADVIL Take 1 tablet (600 mg total) by mouth every 6 (six) hours.   L-Methylfolate 15 MG Tabs Take 15 mg by mouth daily.   Magnesium 400 MG Tabs Take 400 mg by mouth 2 (two) times a week.   oxyCODONE 5 MG immediate release tablet Commonly known as: Oxy IR/ROXICODONE Take 1 tablet (5 mg total) by mouth every 4 (four) hours as needed for up to 5 days for moderate pain.   Prenatal 19 tablet Chew 1 tablet by mouth daily.   SYSTANE OP Place 1 drop into both eyes at bedtime as needed (dry eyes).   Vitamin D 50 MCG (2000 UT) tablet Take 2,000 Units by mouth daily.         Discharge home in stable condition Infant Feeding: Breast Infant Disposition:home with mother Discharge instruction: per After Visit Summary and Postpartum booklet. Activity: Advance as tolerated. Pelvic rest for 6 weeks.  Diet: routine diet Anticipated Birth Control: BTL done PP Postpartum Appointment:6 weeks Additional Postpartum F/U: Postpartum Depression checkup Future Appointments:No future appointments. Follow up Visit:  Follow-up Information     Noland Fordyce, MD Follow up in 6 week(s).   Specialty: Obstetrics and Gynecology Contact information: [address] [place] [address] [phone]                     08/04/2023 Robley Fries, MD

## 2023-08-04 NOTE — Progress Notes (Signed)
  Subjective: POD# 2 Live born female  Birth Weight: 9 lb 8.4 oz (4320 g) APGAR: 8, 8  Newborn Delivery   Birth date/time: 08/02/2023 10:41:00 Delivery type: C-Section, Low Transverse Trial of labor: No C-section categorization: Repeat    Baby name: Juanetta Gosling Delivering provider: Olivia Mackie  circumcision completed Feeding: breast  Pain control at delivery: Spinal  Reports feeling well.  Patient reports tolerating PO.   Breast symptoms:none Pain controlled with  PO meds Denies HA/SOB/C/P/N/V/dizziness. Flatus present. She reports vaginal bleeding as normal, without clots.  She is ambulating, urinating without difficulty.     Objective:   VS:    Vitals:   08/03/23 0122 08/03/23 1427 08/03/23 1940 08/04/23 0536  BP: 97/60 108/67 112/60 (!) 107/54  Pulse: 71 69 72 83  Resp: 17 18 17 16   Temp: 98.6 F (37 C) 99 F (37.2 C) 98.2 F (36.8 C) 97.9 F (36.6 C)  TempSrc: Oral Oral Oral Oral  SpO2: 98%  99% 100%  Weight:      Height:        No intake or output data in the 24 hours ending 08/04/23 1110          Latest Ref Rng & Units 08/03/2023    4:54 AM 08/01/2023    9:33 AM 07/30/2021    5:33 AM  CBC  WBC 4.0 - 10.5 K/uL 16.7  9.4  15.8   Hemoglobin 12.0 - 15.0 g/dL 16.1  09.6  04.5   Hematocrit 36.0 - 46.0 % 31.3  35.9  29.8   Platelets 150 - 400 K/uL 143  160  129       Blood type: --/--/O POS (09/03 4098)  Rubella: Immune (02/21 0000)    Physical Exam:  General: alert, cooperative, and no distress CV: Regular rate and rhythm Resp: clear Abdomen: soft, nontender, normal bowel sounds Incision: clean, dry, intact, and pressure dressing in place Uterine Fundus: firm, below umbilicus, nontender Lochia: minimal Ext: no edema, redness or tenderness in the calves or thighs  Assessment/Plan: 32 y.o.   POD# 2. [id number]                  Principal Problem:   Postpartum care following cesarean delivery and BTS 9/4 Active Problems:   Generalized anxiety  disorder  - stable on meds   Previous cesarean section   Doing well, stable.   Discharge home, post op and postpartum care and warning s/s dw pt

## 2023-09-02 ENCOUNTER — Telehealth (HOSPITAL_COMMUNITY): Payer: Self-pay | Admitting: *Deleted

## 2023-09-02 NOTE — Telephone Encounter (Signed)
09/02/2023  Name: Dana Serrano MRN: [id number] DOB: 12-17-1990  Reason for Call:  Transition of Care Hospital Discharge Call  Contact Status: Patient Contact Status: Complete  Language assistant needed:          Follow-Up Questions: Do You Have Any Concerns About Your Health As You Heal From Delivery?: No Do You Have Any Concerns About Your Infants Health?: No  Edinburgh Postnatal Depression Scale:  In the Past 7 Days:   EPDS not completed at this time. Patient stated, "I don't have a lot of time right now. I have anxiety at base-line, but I feel good right now. And, I have my follow-up appointment in a week or 2, and she checks on me there too." PHQ2-9 Depression Scale:     Discharge Follow-up: Edinburgh score requires follow up?: N/A Patient was advised of the following resources:: Breastfeeding Support Group, Support Group  Post-discharge interventions: Reviewed Newborn Safe Sleep Practices  Signature Deforest Hoyles, RN, 09/02/23, [id number]

## 2024-03-17 ENCOUNTER — Ambulatory Visit
Admission: EM | Admit: 2024-03-17 | Discharge: 2024-03-17 | Disposition: A | Discharge status: Home / Self Care | Attending: Family Medicine | Admitting: Family Medicine

## 2024-03-17 ENCOUNTER — Encounter: Payer: Self-pay | Admitting: Emergency Medicine

## 2024-03-17 ENCOUNTER — Other Ambulatory Visit: Payer: Self-pay

## 2024-03-17 DIAGNOSIS — J069 Acute upper respiratory infection, unspecified: Secondary | ICD-10-CM | POA: Diagnosis not present

## 2024-03-17 HISTORY — DX: Meibomian gland dysfunction of left eye, unspecified eyelid: H02.886

## 2024-03-17 LAB — POC COVID19/FLU A&B COMBO
Covid Antigen, POC: NEGATIVE
Influenza A Antigen, POC: NEGATIVE
Influenza B Antigen, POC: NEGATIVE

## 2024-03-17 MED ORDER — AMOXICILLIN 875 MG PO TABS: 875.0000 mg | ORAL_TABLET | Freq: Two times a day (BID) | ORAL | 0 refills | Status: AC

## 2024-03-17 MED ORDER — FLUTICASONE PROPIONATE 50 MCG/ACT NA SUSP: 1.0000 | Freq: Two times a day (BID) | NASAL | 2 refills | Status: AC

## 2024-03-17 NOTE — ED Provider Notes (Signed)
 [facility]    CSN: [id number] Arrival date & time: 03/17/24  7829      History   Chief Complaint Chief Complaint  Patient presents with   Nasal Congestion    HPI Dana Serrano is a 33 y.o. female.   Patient presenting today with almost a week of nasal congestion, body aches, chills, cough, left ear pressure and bubbling sound.  Denies chest pain, shortness of breath, abdominal pain, vomiting, diarrhea.  So far not trying anything over-the-counter for symptoms as she states she is breast-feeding.  She states her children have all been sick with a viral infection in the past week.  No known history of chronic pulmonary disease.    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 only   IBS (irritable bowel syndrome)    Meibomian gland disease of both eyes     Patient Active Problem List   Diagnosis Date Noted   Previous cesarean section 07/29/2021   Postpartum care following cesarean delivery and BTS 9/4 05/20/2019   Generalized anxiety disorder 03/03/2017    Past Surgical History:  Procedure Laterality Date   BILATERAL SALPINGECTOMY  08/02/2023   Procedure: OPEN BILATERAL SALPINGECTOMY;  Surgeon: Meriam Stamp, MD;  Location: [facility];  Service: Obstetrics;;   BREAST ENHANCEMENT SURGERY     2013   CESAREAN SECTION N/A 05/20/2019   Procedure: CESAREAN SECTION;  Surgeon: Meriam Stamp, MD;  Location: [facility];  Service: Obstetrics;  Laterality: N/A;   CESAREAN SECTION N/A 07/29/2021   Procedure: Repeat CESAREAN SECTION;  Surgeon: Audelia Leaks, MD;  Location: [facility];  Service: Obstetrics;  Laterality: N/A;  EDD: 07/30/21   CESAREAN SECTION WITH BILATERAL TUBAL LIGATION Bilateral 08/02/2023   Procedure: Repeat CESAREAN SECTION WITH BILATERAL TUBAL LIGATION by Salpingectomy;  Surgeon: Meriam Stamp, MD;  Location: [facility];  Service: Obstetrics;  Laterality: Bilateral;  EDD: 08/05/23   TONSILLECTOMY     2009   WISDOM TOOTH EXTRACTION     08.2015    OB History     Gravida  3   Para  3   Term  3   Preterm      AB      Living  3      SAB      IAB      Ectopic      Multiple  0   Live Births  3            Home Medications    Prior to Admission medications   Medication Sig Start Date End Date Taking? Authorizing Provider  amoxicillin  (AMOXIL ) 875 MG tablet Take 1 tablet (875 mg total) by mouth 2 (two) times daily. 03/21/24  Yes Corbin Dess, PA-C  fluticasone  (FLONASE ) 50 MCG/ACT nasal spray Place 1 spray into both nostrils 2 (two) times daily. 03/17/24  Yes Corbin Dess, PA-C  acetaminophen  (TYLENOL ) 500 MG tablet Take 2 tablets (1,000 mg total) by mouth every 6 (six) hours. 08/04/23   Terri Fester, MD  buPROPion  (WELLBUTRIN  XL) 150 MG 24 hr tablet Take 1 tablet by mouth daily.    [provider]  Cholecalciferol (VITAMIN D ) 50 MCG (2000 UT) tablet Take 2,000 Units by mouth daily.    [provider]  FLUoxetine  (  PROZAC ) 20 MG tablet Take 20 mg by mouth daily.    [provider]  ibuprofen  (ADVIL ) 600 MG tablet Take 1 tablet (600 mg total) by mouth every 6 (six) hours. 08/04/23   Terri Fester, MD  L-Methylfolate 15 MG TABS Take 15 mg by mouth daily.    [provider]  Magnesium  400 MG TABS Take 400 mg by mouth 2 (two) times a week.    [provider]  Polyethyl Glycol-Propyl Glycol (SYSTANE OP) Place 1 drop into both eyes at bedtime as needed (dry eyes).    [provider]  Prenatal Vit-Fe Fumarate-FA (PRENATAL 19) tablet Chew 1 tablet by mouth daily.    [provider]    Family History Family History  Problem Relation Age of Onset   Alcoholism Mother    Alcohol abuse Mother    Hypertension Father        Living   Obesity Father    Alcoholism Father    Alcohol abuse Father    Hyperlipidemia  Father    Healthy Brother        x1   Bipolar disorder Maternal Aunt    Drug abuse Paternal Uncle    Early death Paternal Uncle    Skin cancer Maternal Grandmother    Cancer Maternal Grandmother    Renal Disease Maternal Grandfather    Cancer Paternal Grandmother    Breast cancer Paternal Grandmother    Depression Paternal Grandmother    Heart disease Paternal Grandfather    Diabetes Paternal Grandfather    Hyperlipidemia Paternal Grandfather    Heart attack Paternal Grandfather    Hypertension Paternal Grandfather    COPD Paternal Grandfather     Social History Social History   Tobacco Use   Smoking status: Never   Smokeless tobacco: Never  Vaping Use   Vaping status: Never Used  Substance Use Topics   Alcohol use: Yes    Comment: socially, not while preg   Drug use: No     Allergies   Patient has no known allergies.   Review of Systems Review of Systems PER HPI  Physical Exam Triage Vital Signs ED Triage Vitals  Encounter Vitals Group     BP 03/17/24 0950 115/74     Systolic BP Percentile --      Diastolic BP Percentile --      Pulse Rate 03/17/24 0950 90     Resp 03/17/24 0950 20     Temp 03/17/24 0950 98.9 F (37.2 C)     Temp Source 03/17/24 0950 Oral     SpO2 03/17/24 0950 95 %     Weight --      Height --      Head Circumference --      Peak Flow --      Pain Score 03/17/24 0946 4     Pain Loc --      Pain Education --      Exclude from Growth Chart --    No data found.  Updated Vital Signs BP 115/74 (BP Location: Right Arm)   Pulse 90   Temp 98.9 F (37.2 C) (Oral)   Resp 20   SpO2 95%   Breastfeeding Yes   Visual Acuity Right Eye Distance:   Left Eye Distance:   Bilateral Distance:    Right Eye Near:   Left Eye Near:    Bilateral Near:     Physical Exam Vitals and nursing note reviewed.  Constitutional:  Appearance: Normal appearance.  HENT:     Head: Atraumatic.     Right Ear: Tympanic membrane and external ear  normal.     Left Ear: External ear normal.     Ears:     Comments: Left middle ear effusion    Nose: Rhinorrhea present.     Mouth/Throat:     Mouth: Mucous membranes are moist.     Pharynx: Posterior oropharyngeal erythema present.  Eyes:     Extraocular Movements: Extraocular movements intact.     Conjunctiva/sclera: Conjunctivae normal.  Cardiovascular:     Rate and Rhythm: Normal rate and regular rhythm.     Heart sounds: Normal heart sounds.  Pulmonary:     Effort: Pulmonary effort is normal.     Breath sounds: Normal breath sounds. No wheezing.  Musculoskeletal:        General: Normal range of motion.     Cervical back: Normal range of motion and neck supple.  Skin:    General: Skin is warm and dry.  Neurological:     Mental Status: She is alert and oriented to person, place, and time.  Psychiatric:        Mood and Affect: Mood normal.        Thought Content: Thought content normal.    UC Treatments / Results  Labs (all labs ordered are listed, but only abnormal results are displayed) Labs Reviewed  POC COVID19/FLU A&B COMBO    EKG   Radiology No results found.  Procedures Procedures (including critical care time)  Medications Ordered in UC Medications - No data to display  Initial Impression / Assessment and Plan / UC Course  I have reviewed the triage vital signs and the nursing notes.  Pertinent labs & imaging results that were available during my care of the patient were reviewed by me and considered in my medical decision making (see chart for details).     Vital signs and exam overall very reassuring today, suspect viral sinusitis.  No evidence of a bacterial infection today.  Discussed safe over-the-counter medications and home care while breast-feeding, Flonase  prescribed to help with middle ear effusion and sinus pressure.  Discussed saline sinus rinses and other remedies.  She is requesting a prescription for an antibiotic in case her symptoms do  not improve so future prescription sent but discussed that it is not necessary at this time and only to use if significantly worsening over the next 4 to 6 days.    Final Clinical Impressions(s) / UC Diagnoses   Final diagnoses:  Viral URI with cough     Discharge Instructions      I have prescribed Flonase  to be used twice daily to help with your sinus congestion and pressure, ear pressure and fluid buildup.  You may also sparingly take Sudafed, Mucinex and use saline sinus rinses.  If your symptoms are significantly worsening over the next 4 to 6 days start the antibiotic that I have waiting at the pharmacy but at this time you do not have a bacterial infection.  Your exam and vital signs are very reassuring today.    ED Prescriptions     Medication Sig Dispense Auth. Provider   fluticasone  (FLONASE ) 50 MCG/ACT nasal spray Place 1 spray into both nostrils 2 (two) times daily. 16 g Corbin Dess, PA-C   amoxicillin  (AMOXIL ) 875 MG tablet Take 1 tablet (875 mg total) by mouth 2 (two) times daily. 14 tablet Corbin Dess, PA-C  PDMP not reviewed this encounter.   Corbin Dess, [place] 03/17/24 1214

## 2024-03-17 NOTE — ED Triage Notes (Addendum)
 Pt reports chills, nasal congestion, generalized body aches, left ear "bubbling" x1 week. Pt reports is currently breastfeeding as well. Inquiring about viral testing.

## 2024-03-17 NOTE — Discharge Instructions (Signed)
 I have prescribed Flonase  to be used twice daily to help with your sinus congestion and pressure, ear pressure and fluid buildup.  You may also sparingly take Sudafed, Mucinex and use saline sinus rinses.  If your symptoms are significantly worsening over the next 4 to 6 days start the antibiotic that I have waiting at the pharmacy but at this time you do not have a bacterial infection.  Your exam and vital signs are very reassuring today.

## 2024-04-19 ENCOUNTER — Other Ambulatory Visit: Payer: Self-pay | Admitting: Family Medicine
